# Patient Record
Sex: Female | Born: 1945 | Race: White | Hispanic: No | Marital: Married | Smoking: Current every day smoker
Health system: Southern US, Community
[De-identification: ages and names within clinical notes are randomized; demographics above are authoritative.]

## PROBLEM LIST (undated history)

## (undated) DIAGNOSIS — E1159 Type 2 diabetes mellitus with other circulatory complications: Secondary | ICD-10-CM

## (undated) DIAGNOSIS — I2510 Atherosclerotic heart disease of native coronary artery without angina pectoris: Secondary | ICD-10-CM

## (undated) DIAGNOSIS — C50919 Malignant neoplasm of unspecified site of unspecified female breast: Secondary | ICD-10-CM

## (undated) DIAGNOSIS — R42 Dizziness and giddiness: Secondary | ICD-10-CM

## (undated) DIAGNOSIS — E781 Pure hyperglyceridemia: Secondary | ICD-10-CM

## (undated) DIAGNOSIS — I6521 Occlusion and stenosis of right carotid artery: Secondary | ICD-10-CM

## (undated) DIAGNOSIS — Z720 Tobacco use: Secondary | ICD-10-CM

## (undated) DIAGNOSIS — M359 Systemic involvement of connective tissue, unspecified: Secondary | ICD-10-CM

## (undated) DIAGNOSIS — I700 Atherosclerosis of aorta: Secondary | ICD-10-CM

## (undated) DIAGNOSIS — R011 Cardiac murmur, unspecified: Secondary | ICD-10-CM

## (undated) DIAGNOSIS — K922 Gastrointestinal hemorrhage, unspecified: Secondary | ICD-10-CM

## (undated) DIAGNOSIS — Z87891 Personal history of nicotine dependence: Secondary | ICD-10-CM

## (undated) DIAGNOSIS — I6509 Occlusion and stenosis of unspecified vertebral artery: Secondary | ICD-10-CM

## (undated) DIAGNOSIS — Z66 Do not resuscitate: Secondary | ICD-10-CM

## (undated) DIAGNOSIS — I10 Essential (primary) hypertension: Secondary | ICD-10-CM

## (undated) DIAGNOSIS — E119 Type 2 diabetes mellitus without complications: Secondary | ICD-10-CM

## (undated) DIAGNOSIS — I739 Peripheral vascular disease, unspecified: Secondary | ICD-10-CM

## (undated) HISTORY — DX: Tobacco use: Z72.0

## (undated) HISTORY — DX: Essential (primary) hypertension: I10

## (undated) HISTORY — DX: Occlusion and stenosis of unspecified vertebral artery: I65.09

## (undated) HISTORY — DX: Occlusion and stenosis of right carotid artery: I65.21

## (undated) HISTORY — DX: Personal history of nicotine dependence: Z87.891

## (undated) HISTORY — DX: Atherosclerosis of aorta: I70.0

## (undated) HISTORY — DX: Pure hyperglyceridemia: E78.1

## (undated) HISTORY — DX: Do not resuscitate: Z66

## (undated) HISTORY — DX: Gastrointestinal hemorrhage, unspecified: K92.2

## (undated) HISTORY — DX: Type 2 diabetes mellitus with other circulatory complications: E11.59

## (undated) HISTORY — DX: Malignant neoplasm of unspecified site of unspecified female breast: C50.919

## (undated) HISTORY — DX: Type 2 diabetes mellitus without complications: E11.9

---

## 1978-02-20 HISTORY — PX: CHOLECYSTECTOMY: SHX55

## 1988-02-21 HISTORY — PX: BREAST SURGERY: SHX581

## 2012-07-10 DIAGNOSIS — I2510 Atherosclerotic heart disease of native coronary artery without angina pectoris: Secondary | ICD-10-CM | POA: Diagnosis not present

## 2012-07-10 DIAGNOSIS — G4730 Sleep apnea, unspecified: Secondary | ICD-10-CM | POA: Diagnosis not present

## 2012-07-10 DIAGNOSIS — E785 Hyperlipidemia, unspecified: Secondary | ICD-10-CM | POA: Diagnosis not present

## 2012-07-10 DIAGNOSIS — I10 Essential (primary) hypertension: Secondary | ICD-10-CM | POA: Diagnosis not present

## 2012-08-02 DIAGNOSIS — E785 Hyperlipidemia, unspecified: Secondary | ICD-10-CM | POA: Diagnosis not present

## 2012-08-02 DIAGNOSIS — C50919 Malignant neoplasm of unspecified site of unspecified female breast: Secondary | ICD-10-CM | POA: Diagnosis not present

## 2012-08-02 DIAGNOSIS — I10 Essential (primary) hypertension: Secondary | ICD-10-CM | POA: Diagnosis not present

## 2012-08-02 DIAGNOSIS — R5381 Other malaise: Secondary | ICD-10-CM | POA: Diagnosis not present

## 2012-08-19 DIAGNOSIS — I6529 Occlusion and stenosis of unspecified carotid artery: Secondary | ICD-10-CM | POA: Diagnosis not present

## 2012-08-19 DIAGNOSIS — I658 Occlusion and stenosis of other precerebral arteries: Secondary | ICD-10-CM | POA: Diagnosis not present

## 2012-08-19 DIAGNOSIS — I259 Chronic ischemic heart disease, unspecified: Secondary | ICD-10-CM | POA: Diagnosis not present

## 2012-08-20 DIAGNOSIS — I259 Chronic ischemic heart disease, unspecified: Secondary | ICD-10-CM | POA: Diagnosis not present

## 2012-08-20 DIAGNOSIS — I779 Disorder of arteries and arterioles, unspecified: Secondary | ICD-10-CM | POA: Diagnosis not present

## 2012-08-20 DIAGNOSIS — E7800 Pure hypercholesterolemia, unspecified: Secondary | ICD-10-CM | POA: Diagnosis not present

## 2012-08-20 DIAGNOSIS — C50919 Malignant neoplasm of unspecified site of unspecified female breast: Secondary | ICD-10-CM | POA: Diagnosis not present

## 2012-08-20 DIAGNOSIS — E119 Type 2 diabetes mellitus without complications: Secondary | ICD-10-CM | POA: Diagnosis not present

## 2012-08-20 DIAGNOSIS — I10 Essential (primary) hypertension: Secondary | ICD-10-CM | POA: Diagnosis not present

## 2012-08-20 DIAGNOSIS — E785 Hyperlipidemia, unspecified: Secondary | ICD-10-CM | POA: Diagnosis not present

## 2012-09-23 DIAGNOSIS — E119 Type 2 diabetes mellitus without complications: Secondary | ICD-10-CM | POA: Diagnosis not present

## 2012-09-23 DIAGNOSIS — I10 Essential (primary) hypertension: Secondary | ICD-10-CM | POA: Diagnosis not present

## 2012-09-23 DIAGNOSIS — E785 Hyperlipidemia, unspecified: Secondary | ICD-10-CM | POA: Diagnosis not present

## 2012-12-13 DIAGNOSIS — F17200 Nicotine dependence, unspecified, uncomplicated: Secondary | ICD-10-CM | POA: Diagnosis not present

## 2012-12-13 DIAGNOSIS — I6529 Occlusion and stenosis of unspecified carotid artery: Secondary | ICD-10-CM | POA: Diagnosis not present

## 2012-12-24 DIAGNOSIS — C50919 Malignant neoplasm of unspecified site of unspecified female breast: Secondary | ICD-10-CM | POA: Diagnosis not present

## 2012-12-24 DIAGNOSIS — E119 Type 2 diabetes mellitus without complications: Secondary | ICD-10-CM | POA: Diagnosis not present

## 2012-12-24 DIAGNOSIS — I10 Essential (primary) hypertension: Secondary | ICD-10-CM | POA: Diagnosis not present

## 2012-12-24 DIAGNOSIS — E785 Hyperlipidemia, unspecified: Secondary | ICD-10-CM | POA: Diagnosis not present

## 2013-07-09 DIAGNOSIS — I10 Essential (primary) hypertension: Secondary | ICD-10-CM | POA: Diagnosis not present

## 2013-07-09 DIAGNOSIS — IMO0001 Reserved for inherently not codable concepts without codable children: Secondary | ICD-10-CM | POA: Diagnosis not present

## 2013-07-14 DIAGNOSIS — I6529 Occlusion and stenosis of unspecified carotid artery: Secondary | ICD-10-CM | POA: Diagnosis not present

## 2013-07-16 ENCOUNTER — Ambulatory Visit: Payer: Self-pay | Admitting: Family Medicine

## 2013-07-16 DIAGNOSIS — I6529 Occlusion and stenosis of unspecified carotid artery: Secondary | ICD-10-CM | POA: Diagnosis not present

## 2013-07-16 DIAGNOSIS — I379 Nonrheumatic pulmonary valve disorder, unspecified: Secondary | ICD-10-CM | POA: Diagnosis not present

## 2013-07-16 DIAGNOSIS — I079 Rheumatic tricuspid valve disease, unspecified: Secondary | ICD-10-CM | POA: Diagnosis not present

## 2013-07-16 DIAGNOSIS — R55 Syncope and collapse: Secondary | ICD-10-CM | POA: Diagnosis not present

## 2013-07-16 DIAGNOSIS — I080 Rheumatic disorders of both mitral and aortic valves: Secondary | ICD-10-CM | POA: Diagnosis not present

## 2013-07-17 ENCOUNTER — Ambulatory Visit: Payer: Self-pay | Admitting: Family Medicine

## 2013-07-17 DIAGNOSIS — Z1389 Encounter for screening for other disorder: Secondary | ICD-10-CM | POA: Diagnosis not present

## 2013-07-17 DIAGNOSIS — I658 Occlusion and stenosis of other precerebral arteries: Secondary | ICD-10-CM | POA: Diagnosis not present

## 2013-07-17 DIAGNOSIS — I700 Atherosclerosis of aorta: Secondary | ICD-10-CM | POA: Diagnosis not present

## 2013-07-17 DIAGNOSIS — I6509 Occlusion and stenosis of unspecified vertebral artery: Secondary | ICD-10-CM | POA: Diagnosis not present

## 2013-07-17 DIAGNOSIS — I6529 Occlusion and stenosis of unspecified carotid artery: Secondary | ICD-10-CM | POA: Diagnosis not present

## 2013-07-17 DIAGNOSIS — I7090 Unspecified atherosclerosis: Secondary | ICD-10-CM | POA: Diagnosis not present

## 2013-07-17 DIAGNOSIS — F17200 Nicotine dependence, unspecified, uncomplicated: Secondary | ICD-10-CM | POA: Diagnosis not present

## 2013-07-17 LAB — COMPREHENSIVE METABOLIC PANEL
ALBUMIN: 3.9 g/dL (ref 3.4–5.0)
Alkaline Phosphatase: 78 U/L
Anion Gap: 4 — ABNORMAL LOW (ref 7–16)
BILIRUBIN TOTAL: 0.4 mg/dL (ref 0.2–1.0)
BUN: 16 mg/dL (ref 7–18)
CALCIUM: 9.8 mg/dL (ref 8.5–10.1)
Chloride: 102 mmol/L (ref 98–107)
Co2: 30 mmol/L (ref 21–32)
Creatinine: 0.85 mg/dL (ref 0.60–1.30)
EGFR (African American): >60
EGFR (Non-African Amer.): >60
Glucose: 221 mg/dL — ABNORMAL HIGH (ref 65–99)
OSMOLALITY: 280 (ref 275–301)
Potassium: 4.4 mmol/L (ref 3.5–5.1)
SGOT(AST): 22 U/L (ref 15–37)
SGPT (ALT): 20 U/L (ref 12–78)
Sodium: 136 mmol/L (ref 136–145)
TOTAL PROTEIN: 8.5 g/dL — AB (ref 6.4–8.2)

## 2013-07-17 LAB — CBC WITH DIFFERENTIAL/PLATELET
Basophil #: 0.2 10*3/uL — ABNORMAL HIGH (ref 0.0–0.1)
Basophil %: 1.5 %
EOS ABS: 0.1 10*3/uL (ref 0.0–0.7)
Eosinophil %: 0.8 %
HCT: 44.4 % (ref 35.0–47.0)
HGB: 14.5 g/dL (ref 12.0–16.0)
LYMPHS ABS: 2 10*3/uL (ref 1.0–3.6)
Lymphocyte %: 14.4 %
MCH: 29.8 pg (ref 26.0–34.0)
MCHC: 32.6 g/dL (ref 32.0–36.0)
MCV: 91 fL (ref 80–100)
MONO ABS: 0.7 x10 3/mm (ref 0.2–0.9)
Monocyte %: 5 %
NEUTROS ABS: 10.8 10*3/uL — AB (ref 1.4–6.5)
Neutrophil %: 78.3 %
Platelet: 321 10*3/uL (ref 150–440)
RBC: 4.87 10*6/uL (ref 3.80–5.20)
RDW: 13.3 % (ref 11.5–14.5)
WBC: 13.9 10*3/uL — ABNORMAL HIGH (ref 3.6–11.0)

## 2013-07-17 LAB — LIPID PANEL
CHOLESTEROL: 190 mg/dL (ref 0–200)
HDL Cholesterol: 34 mg/dL — ABNORMAL LOW (ref 40–60)
Ldl Cholesterol, Calc: 97 mg/dL (ref 0–100)
Triglycerides: 297 mg/dL — ABNORMAL HIGH (ref 0–200)
VLDL Cholesterol, Calc: 59 mg/dL — ABNORMAL HIGH (ref 5–40)

## 2013-07-17 LAB — APTT: ACTIVATED PTT: 31 s (ref 23.6–35.9)

## 2013-07-17 LAB — PROTIME-INR
INR: 1
PROTHROMBIN TIME: 13.2 s (ref 11.5–14.7)

## 2013-07-18 DIAGNOSIS — C50019 Malignant neoplasm of nipple and areola, unspecified female breast: Secondary | ICD-10-CM | POA: Diagnosis not present

## 2013-07-18 DIAGNOSIS — E785 Hyperlipidemia, unspecified: Secondary | ICD-10-CM | POA: Diagnosis not present

## 2013-07-18 DIAGNOSIS — I10 Essential (primary) hypertension: Secondary | ICD-10-CM | POA: Diagnosis not present

## 2013-07-18 DIAGNOSIS — Z01810 Encounter for preprocedural cardiovascular examination: Secondary | ICD-10-CM | POA: Diagnosis not present

## 2013-07-18 DIAGNOSIS — I6529 Occlusion and stenosis of unspecified carotid artery: Secondary | ICD-10-CM | POA: Diagnosis not present

## 2013-07-23 DIAGNOSIS — Z01810 Encounter for preprocedural cardiovascular examination: Secondary | ICD-10-CM | POA: Diagnosis not present

## 2013-07-23 DIAGNOSIS — Z01818 Encounter for other preprocedural examination: Secondary | ICD-10-CM | POA: Diagnosis not present

## 2013-08-20 HISTORY — PX: CAROTID ENDARTERECTOMY: SUR193

## 2013-08-26 DIAGNOSIS — I6529 Occlusion and stenosis of unspecified carotid artery: Secondary | ICD-10-CM | POA: Diagnosis not present

## 2013-08-26 DIAGNOSIS — F17200 Nicotine dependence, unspecified, uncomplicated: Secondary | ICD-10-CM | POA: Diagnosis not present

## 2013-09-11 ENCOUNTER — Ambulatory Visit: Payer: Self-pay | Admitting: Vascular Surgery

## 2013-09-11 DIAGNOSIS — I10 Essential (primary) hypertension: Secondary | ICD-10-CM | POA: Diagnosis not present

## 2013-09-11 DIAGNOSIS — Z01812 Encounter for preprocedural laboratory examination: Secondary | ICD-10-CM | POA: Diagnosis not present

## 2013-09-11 DIAGNOSIS — I6529 Occlusion and stenosis of unspecified carotid artery: Secondary | ICD-10-CM | POA: Diagnosis not present

## 2013-09-11 DIAGNOSIS — Z01818 Encounter for other preprocedural examination: Secondary | ICD-10-CM | POA: Diagnosis not present

## 2013-09-11 DIAGNOSIS — Z7982 Long term (current) use of aspirin: Secondary | ICD-10-CM | POA: Diagnosis not present

## 2013-09-11 DIAGNOSIS — E785 Hyperlipidemia, unspecified: Secondary | ICD-10-CM | POA: Diagnosis not present

## 2013-09-11 LAB — URINALYSIS, COMPLETE
Bilirubin,UR: NEGATIVE
Blood: NEGATIVE
GLUCOSE, UR: NEGATIVE mg/dL (ref 0–75)
Ketone: NEGATIVE
Leukocyte Esterase: NEGATIVE
Nitrite: NEGATIVE
PROTEIN: NEGATIVE
Ph: 5 (ref 4.5–8.0)
SPECIFIC GRAVITY: 1.017 (ref 1.003–1.030)
Squamous Epithelial: 27
WBC UR: 5 /HPF (ref 0–5)

## 2013-09-11 LAB — CBC
HCT: 39.8 % (ref 35.0–47.0)
HGB: 13.1 g/dL (ref 12.0–16.0)
MCH: 30.5 pg (ref 26.0–34.0)
MCHC: 33 g/dL (ref 32.0–36.0)
MCV: 93 fL (ref 80–100)
PLATELETS: 320 10*3/uL (ref 150–440)
RBC: 4.3 10*6/uL (ref 3.80–5.20)
RDW: 13.4 % (ref 11.5–14.5)
WBC: 12.2 10*3/uL — ABNORMAL HIGH (ref 3.6–11.0)

## 2013-09-11 LAB — BASIC METABOLIC PANEL
ANION GAP: 7 (ref 7–16)
BUN: 17 mg/dL (ref 7–18)
CALCIUM: 9.6 mg/dL (ref 8.5–10.1)
Chloride: 101 mmol/L (ref 98–107)
Co2: 28 mmol/L (ref 21–32)
Creatinine: 0.86 mg/dL (ref 0.60–1.30)
EGFR (Non-African Amer.): >60
Glucose: 203 mg/dL — ABNORMAL HIGH (ref 65–99)
Osmolality: 279 (ref 275–301)
Potassium: 4.5 mmol/L (ref 3.5–5.1)
Sodium: 136 mmol/L (ref 136–145)

## 2013-09-16 DIAGNOSIS — R7309 Other abnormal glucose: Secondary | ICD-10-CM | POA: Diagnosis not present

## 2013-09-16 DIAGNOSIS — N390 Urinary tract infection, site not specified: Secondary | ICD-10-CM | POA: Diagnosis not present

## 2013-09-16 DIAGNOSIS — E119 Type 2 diabetes mellitus without complications: Secondary | ICD-10-CM | POA: Diagnosis not present

## 2013-09-18 ENCOUNTER — Inpatient Hospital Stay: Payer: Self-pay | Admitting: Vascular Surgery

## 2013-09-18 DIAGNOSIS — I6529 Occlusion and stenosis of unspecified carotid artery: Secondary | ICD-10-CM | POA: Diagnosis not present

## 2013-09-18 DIAGNOSIS — Z7982 Long term (current) use of aspirin: Secondary | ICD-10-CM | POA: Diagnosis not present

## 2013-09-18 DIAGNOSIS — I708 Atherosclerosis of other arteries: Secondary | ICD-10-CM | POA: Diagnosis not present

## 2013-09-18 DIAGNOSIS — Z853 Personal history of malignant neoplasm of breast: Secondary | ICD-10-CM | POA: Diagnosis not present

## 2013-09-18 DIAGNOSIS — I10 Essential (primary) hypertension: Secondary | ICD-10-CM | POA: Diagnosis present

## 2013-09-18 DIAGNOSIS — I658 Occlusion and stenosis of other precerebral arteries: Secondary | ICD-10-CM | POA: Diagnosis present

## 2013-09-18 DIAGNOSIS — E785 Hyperlipidemia, unspecified: Secondary | ICD-10-CM | POA: Diagnosis present

## 2013-09-19 LAB — CBC WITH DIFFERENTIAL/PLATELET
BASOS ABS: 0 10*3/uL (ref 0.0–0.1)
Basophil %: 0.2 %
Eosinophil #: 0 10*3/uL (ref 0.0–0.7)
Eosinophil %: 0 %
HCT: 33.6 % — AB (ref 35.0–47.0)
HGB: 11.3 g/dL — ABNORMAL LOW (ref 12.0–16.0)
Lymphocyte #: 0.8 10*3/uL — ABNORMAL LOW (ref 1.0–3.6)
Lymphocyte %: 6.9 %
MCH: 30.4 pg (ref 26.0–34.0)
MCHC: 33.5 g/dL (ref 32.0–36.0)
MCV: 91 fL (ref 80–100)
Monocyte #: 0.4 x10 3/mm (ref 0.2–0.9)
Monocyte %: 3.6 %
NEUTROS ABS: 10.2 10*3/uL — AB (ref 1.4–6.5)
Neutrophil %: 89.3 %
PLATELETS: 233 10*3/uL (ref 150–440)
RBC: 3.71 10*6/uL — ABNORMAL LOW (ref 3.80–5.20)
RDW: 13.3 % (ref 11.5–14.5)
WBC: 11.4 10*3/uL — ABNORMAL HIGH (ref 3.6–11.0)

## 2013-09-19 LAB — APTT: ACTIVATED PTT: 29.3 s (ref 23.6–35.9)

## 2013-09-19 LAB — BASIC METABOLIC PANEL
Anion Gap: 8 (ref 7–16)
BUN: 11 mg/dL (ref 7–18)
CALCIUM: 8.1 mg/dL — AB (ref 8.5–10.1)
CREATININE: 0.7 mg/dL (ref 0.60–1.30)
Chloride: 110 mmol/L — ABNORMAL HIGH (ref 98–107)
Co2: 24 mmol/L (ref 21–32)
EGFR (African American): >60
GLUCOSE: 157 mg/dL — AB (ref 65–99)
Osmolality: 286 (ref 275–301)
Potassium: 3.9 mmol/L (ref 3.5–5.1)
SODIUM: 142 mmol/L (ref 136–145)

## 2013-09-19 LAB — PROTIME-INR
INR: 1.2
PROTHROMBIN TIME: 14.6 s (ref 11.5–14.7)

## 2013-09-22 LAB — PATHOLOGY REPORT

## 2013-10-02 DIAGNOSIS — I10 Essential (primary) hypertension: Secondary | ICD-10-CM | POA: Diagnosis not present

## 2013-10-02 DIAGNOSIS — E785 Hyperlipidemia, unspecified: Secondary | ICD-10-CM | POA: Diagnosis not present

## 2013-10-02 DIAGNOSIS — D649 Anemia, unspecified: Secondary | ICD-10-CM | POA: Diagnosis not present

## 2013-10-02 DIAGNOSIS — K922 Gastrointestinal hemorrhage, unspecified: Secondary | ICD-10-CM | POA: Diagnosis not present

## 2013-10-02 LAB — BASIC METABOLIC PANEL
Anion Gap: 12 (ref 7–16)
BUN: 24 mg/dL — ABNORMAL HIGH (ref 7–18)
CHLORIDE: 101 mmol/L (ref 98–107)
Calcium, Total: 8.8 mg/dL (ref 8.5–10.1)
Co2: 25 mmol/L (ref 21–32)
Creatinine: 0.79 mg/dL (ref 0.60–1.30)
EGFR (African American): >60
EGFR (Non-African Amer.): >60
GLUCOSE: 242 mg/dL — AB (ref 65–99)
OSMOLALITY: 288 (ref 275–301)
Potassium: 3.6 mmol/L (ref 3.5–5.1)
Sodium: 138 mmol/L (ref 136–145)

## 2013-10-02 LAB — CBC WITH DIFFERENTIAL/PLATELET
Basophil #: 0.1 10*3/uL (ref 0.0–0.1)
Basophil #: 0.2 10*3/uL — ABNORMAL HIGH (ref 0.0–0.1)
Basophil %: 0.8 %
Basophil %: 0.9 %
EOS ABS: 0.2 10*3/uL (ref 0.0–0.7)
EOS PCT: 1.4 %
Eosinophil #: 0.2 10*3/uL (ref 0.0–0.7)
Eosinophil %: 0.9 %
HCT: 23.5 % — ABNORMAL LOW (ref 35.0–47.0)
HCT: 25.7 % — ABNORMAL LOW (ref 35.0–47.0)
HGB: 8.3 g/dL — ABNORMAL LOW (ref 12.0–16.0)
HGB: 7.7 g/dL — AB (ref 12.0–16.0)
Lymphocyte #: 2.9 10*3/uL (ref 1.0–3.6)
Lymphocyte #: 3.5 10*3/uL (ref 1.0–3.6)
Lymphocyte %: 18.7 %
Lymphocyte %: 20 %
MCH: 29.9 pg (ref 26.0–34.0)
MCH: 30 pg (ref 26.0–34.0)
MCHC: 32.3 g/dL (ref 32.0–36.0)
MCHC: 32.6 g/dL (ref 32.0–36.0)
MCV: 93 fL (ref 80–100)
MCV: 92 fL (ref 80–100)
MONO ABS: 0.7 x10 3/mm (ref 0.2–0.9)
MONO ABS: 0.9 x10 3/mm (ref 0.2–0.9)
Monocyte %: 4.8 %
Monocyte %: 5.1 %
NEUTROS ABS: 11.5 10*3/uL — AB (ref 1.4–6.5)
NEUTROS ABS: 12.8 10*3/uL — AB (ref 1.4–6.5)
Neutrophil %: 73.1 %
Neutrophil %: 74.3 %
Platelet: 303 10*3/uL (ref 150–440)
Platelet: 360 10*3/uL (ref 150–440)
RBC: 2.77 10*6/uL — ABNORMAL LOW (ref 3.80–5.20)
RBC: 2.55 10*6/uL — AB (ref 3.80–5.20)
RDW: 12.9 % (ref 11.5–14.5)
RDW: 13.3 % (ref 11.5–14.5)
WBC: 15.5 10*3/uL — ABNORMAL HIGH (ref 3.6–11.0)
WBC: 17.5 10*3/uL — ABNORMAL HIGH (ref 3.6–11.0)

## 2013-10-03 DIAGNOSIS — K921 Melena: Secondary | ICD-10-CM | POA: Diagnosis not present

## 2013-10-03 DIAGNOSIS — R12 Heartburn: Secondary | ICD-10-CM | POA: Diagnosis not present

## 2013-10-03 DIAGNOSIS — R1013 Epigastric pain: Secondary | ICD-10-CM | POA: Diagnosis not present

## 2013-10-03 DIAGNOSIS — I10 Essential (primary) hypertension: Secondary | ICD-10-CM | POA: Diagnosis not present

## 2013-10-03 DIAGNOSIS — E785 Hyperlipidemia, unspecified: Secondary | ICD-10-CM | POA: Diagnosis not present

## 2013-10-03 DIAGNOSIS — K2981 Duodenitis with bleeding: Secondary | ICD-10-CM | POA: Diagnosis not present

## 2013-10-03 DIAGNOSIS — K2940 Chronic atrophic gastritis without bleeding: Secondary | ICD-10-CM | POA: Diagnosis not present

## 2013-10-03 DIAGNOSIS — K922 Gastrointestinal hemorrhage, unspecified: Secondary | ICD-10-CM | POA: Diagnosis not present

## 2013-10-03 DIAGNOSIS — Z7982 Long term (current) use of aspirin: Secondary | ICD-10-CM | POA: Diagnosis not present

## 2013-10-03 DIAGNOSIS — K449 Diaphragmatic hernia without obstruction or gangrene: Secondary | ICD-10-CM | POA: Diagnosis not present

## 2013-10-03 DIAGNOSIS — D62 Acute posthemorrhagic anemia: Secondary | ICD-10-CM | POA: Diagnosis not present

## 2013-10-03 LAB — CBC WITH DIFFERENTIAL/PLATELET
BASOS PCT: 0.3 %
Basophil #: 0.1 10*3/uL (ref 0.0–0.1)
Basophil #: 0.1 10*3/uL (ref 0.0–0.1)
Basophil %: 0.6 %
EOS ABS: 0.2 10*3/uL (ref 0.0–0.7)
EOS PCT: 1.4 %
Eosinophil #: 0 10*3/uL (ref 0.0–0.7)
Eosinophil %: 0.1 %
HCT: 20.9 % — ABNORMAL LOW (ref 35.0–47.0)
HCT: 25.2 % — ABNORMAL LOW (ref 35.0–47.0)
HGB: 6.8 g/dL — ABNORMAL LOW (ref 12.0–16.0)
HGB: 8.3 g/dL — ABNORMAL LOW (ref 12.0–16.0)
Lymphocyte #: 2.5 10*3/uL (ref 1.0–3.6)
Lymphocyte #: 1.5 10*3/uL (ref 1.0–3.6)
Lymphocyte %: 18.7 %
Lymphocyte %: 7.4 %
MCH: 30 pg (ref 26.0–34.0)
MCH: 30.1 pg (ref 26.0–34.0)
MCHC: 32.5 g/dL (ref 32.0–36.0)
MCHC: 32.7 g/dL (ref 32.0–36.0)
MCV: 92 fL (ref 80–100)
MCV: 92 fL (ref 80–100)
MONOS PCT: 4.2 %
Monocyte #: 0.6 x10 3/mm (ref 0.2–0.9)
Monocyte #: 0.5 x10 3/mm (ref 0.2–0.9)
Monocyte %: 2.6 %
NEUTROS ABS: 10.2 10*3/uL — AB (ref 1.4–6.5)
NEUTROS PCT: 89.6 %
Neutrophil #: 18.2 10*3/uL — ABNORMAL HIGH (ref 1.4–6.5)
Neutrophil %: 75.1 %
Platelet: 279 10*3/uL (ref 150–440)
Platelet: 277 10*3/uL (ref 150–440)
RBC: 2.27 10*6/uL — AB (ref 3.80–5.20)
RBC: 2.75 10*6/uL — AB (ref 3.80–5.20)
RDW: 13.2 % (ref 11.5–14.5)
RDW: 13.2 % (ref 11.5–14.5)
WBC: 13.6 10*3/uL — ABNORMAL HIGH (ref 3.6–11.0)
WBC: 20.3 10*3/uL — AB (ref 3.6–11.0)

## 2013-10-04 ENCOUNTER — Inpatient Hospital Stay: Payer: Self-pay | Admitting: Internal Medicine

## 2013-10-04 DIAGNOSIS — K2981 Duodenitis with bleeding: Secondary | ICD-10-CM | POA: Diagnosis not present

## 2013-10-04 DIAGNOSIS — K264 Chronic or unspecified duodenal ulcer with hemorrhage: Secondary | ICD-10-CM | POA: Diagnosis not present

## 2013-10-04 DIAGNOSIS — K449 Diaphragmatic hernia without obstruction or gangrene: Secondary | ICD-10-CM | POA: Diagnosis present

## 2013-10-04 DIAGNOSIS — K2970 Gastritis, unspecified, without bleeding: Secondary | ICD-10-CM | POA: Diagnosis present

## 2013-10-04 DIAGNOSIS — E785 Hyperlipidemia, unspecified: Secondary | ICD-10-CM | POA: Diagnosis present

## 2013-10-04 DIAGNOSIS — I10 Essential (primary) hypertension: Secondary | ICD-10-CM | POA: Diagnosis not present

## 2013-10-04 DIAGNOSIS — K922 Gastrointestinal hemorrhage, unspecified: Secondary | ICD-10-CM | POA: Diagnosis not present

## 2013-10-04 DIAGNOSIS — R7309 Other abnormal glucose: Secondary | ICD-10-CM | POA: Diagnosis present

## 2013-10-04 DIAGNOSIS — K5289 Other specified noninfective gastroenteritis and colitis: Secondary | ICD-10-CM | POA: Diagnosis not present

## 2013-10-04 DIAGNOSIS — I2510 Atherosclerotic heart disease of native coronary artery without angina pectoris: Secondary | ICD-10-CM | POA: Diagnosis present

## 2013-10-04 DIAGNOSIS — F17200 Nicotine dependence, unspecified, uncomplicated: Secondary | ICD-10-CM | POA: Diagnosis present

## 2013-10-04 DIAGNOSIS — D62 Acute posthemorrhagic anemia: Secondary | ICD-10-CM | POA: Diagnosis not present

## 2013-10-04 DIAGNOSIS — Z853 Personal history of malignant neoplasm of breast: Secondary | ICD-10-CM | POA: Diagnosis not present

## 2013-10-04 DIAGNOSIS — K2990 Gastroduodenitis, unspecified, without bleeding: Secondary | ICD-10-CM | POA: Diagnosis present

## 2013-10-04 DIAGNOSIS — Z7982 Long term (current) use of aspirin: Secondary | ICD-10-CM | POA: Diagnosis not present

## 2013-10-04 LAB — CBC WITH DIFFERENTIAL/PLATELET
BASOS ABS: 0.1 10*3/uL (ref 0.0–0.1)
Basophil %: 0.6 %
Eosinophil #: 0.1 10*3/uL (ref 0.0–0.7)
Eosinophil %: 0.6 %
HCT: 22.5 % — AB (ref 35.0–47.0)
HGB: 7.5 g/dL — ABNORMAL LOW (ref 12.0–16.0)
LYMPHS ABS: 2.5 10*3/uL (ref 1.0–3.6)
Lymphocyte %: 18 %
MCH: 30.3 pg (ref 26.0–34.0)
MCHC: 33.5 g/dL (ref 32.0–36.0)
MCV: 91 fL (ref 80–100)
MONO ABS: 0.6 x10 3/mm (ref 0.2–0.9)
MONOS PCT: 4.5 %
NEUTROS PCT: 76.3 %
Neutrophil #: 10.4 10*3/uL — ABNORMAL HIGH (ref 1.4–6.5)
PLATELETS: 275 10*3/uL (ref 150–440)
RBC: 2.49 10*6/uL — ABNORMAL LOW (ref 3.80–5.20)
RDW: 13.5 % (ref 11.5–14.5)
WBC: 13.6 10*3/uL — ABNORMAL HIGH (ref 3.6–11.0)

## 2013-10-04 LAB — PROTIME-INR
INR: 1.1
Prothrombin Time: 14.5 secs (ref 11.5–14.7)

## 2013-10-04 LAB — BASIC METABOLIC PANEL
Anion Gap: 6 — ABNORMAL LOW (ref 7–16)
BUN: 11 mg/dL (ref 7–18)
CO2: 27 mmol/L (ref 21–32)
Calcium, Total: 7.9 mg/dL — ABNORMAL LOW (ref 8.5–10.1)
Chloride: 109 mmol/L — ABNORMAL HIGH (ref 98–107)
Creatinine: 0.74 mg/dL (ref 0.60–1.30)
EGFR (African American): >60
EGFR (Non-African Amer.): >60
GLUCOSE: 132 mg/dL — AB (ref 65–99)
OSMOLALITY: 284 (ref 275–301)
Potassium: 3.5 mmol/L (ref 3.5–5.1)
Sodium: 142 mmol/L (ref 136–145)

## 2013-10-05 DIAGNOSIS — K922 Gastrointestinal hemorrhage, unspecified: Secondary | ICD-10-CM | POA: Diagnosis not present

## 2013-10-05 DIAGNOSIS — I10 Essential (primary) hypertension: Secondary | ICD-10-CM | POA: Diagnosis not present

## 2013-10-05 DIAGNOSIS — E785 Hyperlipidemia, unspecified: Secondary | ICD-10-CM | POA: Diagnosis not present

## 2013-10-05 DIAGNOSIS — D62 Acute posthemorrhagic anemia: Secondary | ICD-10-CM | POA: Diagnosis not present

## 2013-10-05 LAB — CBC WITH DIFFERENTIAL/PLATELET
Basophil #: 0.1 10*3/uL (ref 0.0–0.1)
Basophil %: 0.8 %
Eosinophil #: 0.2 10*3/uL (ref 0.0–0.7)
Eosinophil %: 1.9 %
HCT: 28.1 % — AB (ref 35.0–47.0)
HGB: 9.7 g/dL — ABNORMAL LOW (ref 12.0–16.0)
Lymphocyte #: 2.1 10*3/uL (ref 1.0–3.6)
Lymphocyte %: 24.2 %
MCH: 32 pg (ref 26.0–34.0)
MCHC: 34.5 g/dL (ref 32.0–36.0)
MCV: 93 fL (ref 80–100)
MONOS PCT: 6.3 %
Monocyte #: 0.6 x10 3/mm (ref 0.2–0.9)
NEUTROS ABS: 5.9 10*3/uL (ref 1.4–6.5)
NEUTROS PCT: 66.8 %
Platelet: 273 10*3/uL (ref 150–440)
RBC: 3.04 10*6/uL — ABNORMAL LOW (ref 3.80–5.20)
RDW: 14 % (ref 11.5–14.5)
WBC: 8.8 10*3/uL (ref 3.6–11.0)

## 2013-10-05 LAB — BASIC METABOLIC PANEL
ANION GAP: 9 (ref 7–16)
BUN: 7 mg/dL (ref 7–18)
CALCIUM: 7.9 mg/dL — AB (ref 8.5–10.1)
Chloride: 113 mmol/L — ABNORMAL HIGH (ref 98–107)
Co2: 25 mmol/L (ref 21–32)
Creatinine: 0.64 mg/dL (ref 0.60–1.30)
EGFR (Non-African Amer.): >60
GLUCOSE: 143 mg/dL — AB (ref 65–99)
OSMOLALITY: 293 (ref 275–301)
Potassium: 3.4 mmol/L — ABNORMAL LOW (ref 3.5–5.1)
SODIUM: 147 mmol/L — AB (ref 136–145)

## 2013-10-06 LAB — CBC WITH DIFFERENTIAL/PLATELET
BASOS PCT: 0.5 %
Basophil #: 0 10*3/uL (ref 0.0–0.1)
Eosinophil #: 0.2 10*3/uL (ref 0.0–0.7)
Eosinophil %: 1.9 %
HCT: 27.3 % — AB (ref 35.0–47.0)
HGB: 9.5 g/dL — AB (ref 12.0–16.0)
LYMPHS ABS: 1.8 10*3/uL (ref 1.0–3.6)
LYMPHS PCT: 19.9 %
MCH: 32.3 pg (ref 26.0–34.0)
MCHC: 34.6 g/dL (ref 32.0–36.0)
MCV: 93 fL (ref 80–100)
Monocyte #: 0.5 x10 3/mm (ref 0.2–0.9)
Monocyte %: 6.1 %
NEUTROS ABS: 6.4 10*3/uL (ref 1.4–6.5)
Neutrophil %: 71.6 %
Platelet: 289 10*3/uL (ref 150–440)
RBC: 2.93 10*6/uL — ABNORMAL LOW (ref 3.80–5.20)
RDW: 14.4 % (ref 11.5–14.5)
WBC: 8.9 10*3/uL (ref 3.6–11.0)

## 2013-10-09 DIAGNOSIS — I959 Hypotension, unspecified: Secondary | ICD-10-CM | POA: Diagnosis not present

## 2013-10-09 DIAGNOSIS — E119 Type 2 diabetes mellitus without complications: Secondary | ICD-10-CM | POA: Diagnosis not present

## 2013-10-09 DIAGNOSIS — K922 Gastrointestinal hemorrhage, unspecified: Secondary | ICD-10-CM | POA: Diagnosis not present

## 2013-10-21 DIAGNOSIS — I6529 Occlusion and stenosis of unspecified carotid artery: Secondary | ICD-10-CM | POA: Diagnosis not present

## 2013-10-21 DIAGNOSIS — K264 Chronic or unspecified duodenal ulcer with hemorrhage: Secondary | ICD-10-CM | POA: Diagnosis not present

## 2013-11-14 ENCOUNTER — Ambulatory Visit: Payer: Self-pay | Admitting: Unknown Physician Specialty

## 2013-11-14 DIAGNOSIS — E7800 Pure hypercholesterolemia, unspecified: Secondary | ICD-10-CM | POA: Diagnosis not present

## 2013-11-14 DIAGNOSIS — K2990 Gastroduodenitis, unspecified, without bleeding: Secondary | ICD-10-CM | POA: Diagnosis not present

## 2013-11-14 DIAGNOSIS — Z853 Personal history of malignant neoplasm of breast: Secondary | ICD-10-CM | POA: Diagnosis not present

## 2013-11-14 DIAGNOSIS — Z9010 Acquired absence of unspecified breast and nipple: Secondary | ICD-10-CM | POA: Diagnosis not present

## 2013-11-14 DIAGNOSIS — Z7982 Long term (current) use of aspirin: Secondary | ICD-10-CM | POA: Diagnosis not present

## 2013-11-14 DIAGNOSIS — I10 Essential (primary) hypertension: Secondary | ICD-10-CM | POA: Diagnosis not present

## 2013-11-14 DIAGNOSIS — Z79899 Other long term (current) drug therapy: Secondary | ICD-10-CM | POA: Diagnosis not present

## 2013-11-14 DIAGNOSIS — K2940 Chronic atrophic gastritis without bleeding: Secondary | ICD-10-CM | POA: Diagnosis not present

## 2013-11-14 DIAGNOSIS — K2211 Ulcer of esophagus with bleeding: Secondary | ICD-10-CM | POA: Diagnosis not present

## 2013-11-14 DIAGNOSIS — K5289 Other specified noninfective gastroenteritis and colitis: Secondary | ICD-10-CM | POA: Diagnosis not present

## 2013-11-14 DIAGNOSIS — I6529 Occlusion and stenosis of unspecified carotid artery: Secondary | ICD-10-CM | POA: Diagnosis not present

## 2013-11-14 DIAGNOSIS — K2970 Gastritis, unspecified, without bleeding: Secondary | ICD-10-CM | POA: Diagnosis not present

## 2013-11-18 LAB — PATHOLOGY REPORT

## 2014-01-29 DIAGNOSIS — E781 Pure hyperglyceridemia: Secondary | ICD-10-CM | POA: Diagnosis not present

## 2014-01-29 DIAGNOSIS — I708 Atherosclerosis of other arteries: Secondary | ICD-10-CM | POA: Diagnosis not present

## 2014-01-29 DIAGNOSIS — I10 Essential (primary) hypertension: Secondary | ICD-10-CM | POA: Diagnosis not present

## 2014-01-29 DIAGNOSIS — I739 Peripheral vascular disease, unspecified: Secondary | ICD-10-CM | POA: Diagnosis not present

## 2014-01-29 DIAGNOSIS — E1165 Type 2 diabetes mellitus with hyperglycemia: Secondary | ICD-10-CM | POA: Diagnosis not present

## 2014-01-29 DIAGNOSIS — R809 Proteinuria, unspecified: Secondary | ICD-10-CM | POA: Diagnosis not present

## 2014-03-23 DIAGNOSIS — H6690 Otitis media, unspecified, unspecified ear: Secondary | ICD-10-CM | POA: Diagnosis not present

## 2014-03-23 DIAGNOSIS — H6122 Impacted cerumen, left ear: Secondary | ICD-10-CM | POA: Diagnosis not present

## 2014-03-23 DIAGNOSIS — B009 Herpesviral infection, unspecified: Secondary | ICD-10-CM | POA: Diagnosis not present

## 2014-04-30 DIAGNOSIS — E1165 Type 2 diabetes mellitus with hyperglycemia: Secondary | ICD-10-CM | POA: Diagnosis not present

## 2014-04-30 DIAGNOSIS — L609 Nail disorder, unspecified: Secondary | ICD-10-CM | POA: Diagnosis not present

## 2014-04-30 DIAGNOSIS — L608 Other nail disorders: Secondary | ICD-10-CM | POA: Diagnosis not present

## 2014-04-30 DIAGNOSIS — E781 Pure hyperglyceridemia: Secondary | ICD-10-CM | POA: Diagnosis not present

## 2014-04-30 DIAGNOSIS — I739 Peripheral vascular disease, unspecified: Secondary | ICD-10-CM | POA: Diagnosis not present

## 2014-04-30 DIAGNOSIS — R011 Cardiac murmur, unspecified: Secondary | ICD-10-CM | POA: Diagnosis not present

## 2014-04-30 DIAGNOSIS — I10 Essential (primary) hypertension: Secondary | ICD-10-CM | POA: Diagnosis not present

## 2014-04-30 DIAGNOSIS — R809 Proteinuria, unspecified: Secondary | ICD-10-CM | POA: Diagnosis not present

## 2014-05-07 ENCOUNTER — Ambulatory Visit
Admit: 2014-05-07 | Disposition: A | Discharge status: Home / Self Care | Payer: Self-pay | Attending: Family Medicine | Admitting: Family Medicine

## 2014-05-08 ENCOUNTER — Ambulatory Visit: Payer: Self-pay | Admitting: Family Medicine

## 2014-05-08 DIAGNOSIS — E785 Hyperlipidemia, unspecified: Secondary | ICD-10-CM | POA: Diagnosis not present

## 2014-05-08 DIAGNOSIS — I2510 Atherosclerotic heart disease of native coronary artery without angina pectoris: Secondary | ICD-10-CM | POA: Diagnosis not present

## 2014-05-08 DIAGNOSIS — I70213 Atherosclerosis of native arteries of extremities with intermittent claudication, bilateral legs: Secondary | ICD-10-CM | POA: Diagnosis not present

## 2014-05-08 DIAGNOSIS — Z122 Encounter for screening for malignant neoplasm of respiratory organs: Secondary | ICD-10-CM | POA: Diagnosis not present

## 2014-05-08 DIAGNOSIS — I6529 Occlusion and stenosis of unspecified carotid artery: Secondary | ICD-10-CM | POA: Diagnosis not present

## 2014-05-08 DIAGNOSIS — R918 Other nonspecific abnormal finding of lung field: Secondary | ICD-10-CM | POA: Diagnosis not present

## 2014-05-08 DIAGNOSIS — I10 Essential (primary) hypertension: Secondary | ICD-10-CM | POA: Diagnosis not present

## 2014-05-08 DIAGNOSIS — M79609 Pain in unspecified limb: Secondary | ICD-10-CM | POA: Diagnosis not present

## 2014-05-08 DIAGNOSIS — F17210 Nicotine dependence, cigarettes, uncomplicated: Secondary | ICD-10-CM | POA: Diagnosis not present

## 2014-05-08 DIAGNOSIS — F17200 Nicotine dependence, unspecified, uncomplicated: Secondary | ICD-10-CM | POA: Diagnosis not present

## 2014-05-14 DIAGNOSIS — I10 Essential (primary) hypertension: Secondary | ICD-10-CM | POA: Diagnosis not present

## 2014-05-22 ENCOUNTER — Ambulatory Visit
Admit: 2014-05-22 | Disposition: A | Discharge status: Home / Self Care | Payer: Self-pay | Attending: Family Medicine | Admitting: Family Medicine

## 2014-05-23 ENCOUNTER — Encounter: Payer: Self-pay | Admitting: Family Medicine

## 2014-05-23 DIAGNOSIS — E1159 Type 2 diabetes mellitus with other circulatory complications: Secondary | ICD-10-CM | POA: Insufficient documentation

## 2014-05-23 DIAGNOSIS — I10 Essential (primary) hypertension: Secondary | ICD-10-CM | POA: Insufficient documentation

## 2014-05-23 DIAGNOSIS — R809 Proteinuria, unspecified: Secondary | ICD-10-CM | POA: Insufficient documentation

## 2014-05-23 DIAGNOSIS — I6509 Occlusion and stenosis of unspecified vertebral artery: Secondary | ICD-10-CM | POA: Insufficient documentation

## 2014-05-23 DIAGNOSIS — C50919 Malignant neoplasm of unspecified site of unspecified female breast: Secondary | ICD-10-CM | POA: Insufficient documentation

## 2014-05-23 DIAGNOSIS — I700 Atherosclerosis of aorta: Secondary | ICD-10-CM | POA: Insufficient documentation

## 2014-05-23 DIAGNOSIS — R011 Cardiac murmur, unspecified: Secondary | ICD-10-CM

## 2014-05-23 DIAGNOSIS — Z720 Tobacco use: Secondary | ICD-10-CM | POA: Insufficient documentation

## 2014-05-23 DIAGNOSIS — I739 Peripheral vascular disease, unspecified: Secondary | ICD-10-CM

## 2014-05-23 DIAGNOSIS — E781 Pure hyperglyceridemia: Secondary | ICD-10-CM | POA: Insufficient documentation

## 2014-05-23 DIAGNOSIS — I6521 Occlusion and stenosis of right carotid artery: Secondary | ICD-10-CM | POA: Insufficient documentation

## 2014-06-13 NOTE — Consult Note (Signed)
Pt had active bleeding from 2ed portion of duodenum on lateral wall which we treated with epi and cauterization. Spoke to Dr. Benjie Karvonen and she will move to CCU due to chance of rebleed.  Will consult surgery also.  Check hgb q6 hours for now. Pt had CEA 2 weeks ago.   Electronic Signatures: Manya Silvas (MD)  (Signed on 14-Aug-15 17:22)  Authored  Last Updated: 14-Aug-15 17:22 by Manya Silvas (MD)

## 2014-06-13 NOTE — Consult Note (Signed)
PATIENT NAME:  CARRI, SPILLERS MR#:  [id number] DATE OF BIRTH:  1946/02/01  DATE OF CONSULTATION:  10/03/2013  REFERRING PHYSICIAN:   CONSULTING PHYSICIAN:  Shaneika Rossa A. Lashe Oliveira, MD  REASON FOR CONSULTATION: Gastrointestinal bleed following carotid endarterectomy on aspirin and Plavix.   HISTORY OF PRESENT ILLNESS: Ms. Tierney is a pleasant 69 year old female with a history of hypertension, who presented with dark stools after a recent carotid endarterectomy. She was on aspirin and Plavix. This has been going on. She had talked to Dr. Lucky Cowboy, who took her off Plavix 4 days ago and began aspirin 81 down from 324. She began having intermittent abdominal pain, epigastric and burning. She denies any other further symptoms except for some fatigue. No fevers, chills, night sweats, shortness of breath, cough, chest pain, current nausea or vomiting. She does report black tarry stools. No diarrhea, constipation, dysuria or hematuria. She had an EGD, which showed a bleeding area on the wall of the second portion of her duodenum, which was injected with epinephrine.   PAST MEDICAL HISTORY: Hypertension, hyperlipidemia, recent left carotid endarterectomy.   HOME MEDICATIONS: At the time of arrival, she is on: Aspirin 81, Zocor, hydrochlorothiazide, lisinopril, metoprolol.   SOCIAL HISTORY: History of remote tobacco use. Denies alcohol or drugs.   FAMILY HISTORY: Positive for coronary artery disease as well as CVA.   ALLERGIES: No known drug allergies.   REVIEW OF SYSTEMS: A 12-point review of systems was obtained. Pertinent positives and negatives as above.   PHYSICAL EXAMINATION:  VITAL SIGNS: At the time of visiting her this morning, her temperature was 98.4, pulse of 84, blood pressure 98/44, respirations 16, 97% on room air.  GENERAL: No acute distress. Alert and oriented x 3.  HEAD: Normocephalic, atraumatic.  EYES: No scleral icterus. No conjunctivitis.  FACE: No obvious facial trauma. Normal  external nose. Normal external ears.  CHEST: Lungs clear to auscultation. Moving air well.  HEART: Regular rate and rhythm. No murmur, rubs, or gallops.  ABDOMEN: Soft, nontender, nondistended.  EXTREMITIES: Moves all extremities well. Strength 5/5.  NEUROLOGIC: Cranial nerves II through XII grossly intact.   LABORATORY DATA: Currently white cell count of 13.6; 15.5 at admission; 7.5 after 1 unit of blood, down from 8.3 at admission. Platelets 275,000.   ASSESSMENT AND PLAN: Ms. 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 She does not have sequelae of bleeding at this time. We will follow up on transfusion. If the patient begins having significant bleeding, this is in a difficult spot and may require transfer to [facility] setting to a pancreatic/upper GI surgeon who is more familiar with the anatomy in this area. We will continue to follow along while in-house.    ____________________________ Glena Norfolk. Lavonia Eager, MD cal:MT D: 10/04/2013 11:55:00 ET T: 10/04/2013 13:00:43 ET JOB#: [id number]  cc: Harrell Gave A. Jevon Littlepage, MD, <Dictator> Floyde Parkins MD ELECTRONICALLY SIGNED 10/13/2013 21:09

## 2014-06-13 NOTE — Discharge Summary (Signed)
PATIENT NAME:  Julia Cunningham, Julia Cunningham MR#:  [id number] DATE OF BIRTH:  01/29/1946  DATE OF ADMISSION:  10/04/2013 DATE OF DISCHARGE:  10/06/2013  DISCHARGE DIAGNOSES:  1.  Acute blood loss anemia, status post 2 units of packed RBC transfusion.  2.  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  3.  Recent carotid endarterectomy, on aspirin, Plavix.  4.  Hypertension.  5.  Hyperlipidemia. 6.  Tobacco abuse.   CONSULTATIONS: Gaylyn Cheers, MD  PROCEDURE: Upper gastrointestinal endoscopy showed normal esophagus, hiatal hernia, gastritis, blood in duodenal bulb and second part of duodenum.   ADMITTING HISTORY AND PHYSICAL: Please see detailed H and P dictated previously by Dr. Lavetta Nielsen. In brief, a 68 year old female patient brought into the hospital with dark stools. She was recently started on aspirin and Plavix after getting carotid endarterectomy. Was admitted after her stool was positive for blood with hemoglobin at 8.3.   HOSPITAL COURSE: 1.  Upper GI bleed. The patient's hemoglobin dropped. She was given 2 units of packed RBC transfusion because of her dark stools and stools being positive for blood and drop in hemoglobin, The patient was on chronic IV drip.  Later taken to EGD by Dr. Vira Agar after stabilization. Was found to have some blood in her duodenum, although no acute bleeding, possibly Dieulafoy lesion. The patient's hemoglobin has been stable. She is tolerating a diet. Has been on PPIs and is being discharged home with stable hemoglobin and no further bleeding.  2.  The patient's hypertension has been fairly controlled for her acute blood loss anemia. She is being started on iron supplementation, PPIs. The patient will start back on her aspirin as per Dr. Percell Boston recommendations from 10/09/2013.  PHYSICAL EXAMINATION:  On examination, the patient has no abdominal tenderness, bowel signs are normal.  Heart sounds S1 and S2 with lungs clear.  DISCHARGE MEDICATIONS:  1.   Protonix 40 mg 2 times a day.  2.  Aspirin 81 mg daily.  3.  Ferrous sulfate 325 mg oral 2 times a day with meals.  4.  Hydrochlorothiazide 25/lisinopril 20 one tablet daily.  5.  Simvastatin 40 mg daily.  6.  Metoprolol tartrate 50 mg 2 times a day.   DISCHARGE INSTRUCTIONS: Low-fat, low-cholesterol diet. Activity as tolerated. Follow up with Dr. Vira Agar in 1 to 2 weeks and primary care physician in 1 week. The patient needs to restart her aspirin from 10/09/2013 and return to the Emergency Room if there are any signs of bleeding.   The patient was also counseled for greater than 3 minutes to quit smoking on day of discharge.   TIME SPENT ON DAY OF DISCHARGE IN DISCHARGE ACTIVITY:  45 minutes    ____________________________ [name], MD srs:DT D: 10/06/2013 13:36:15 ET T: 10/06/2013 14:19:53 ET JOB#: [id number]  cc: Alveta Heimlich R. Darvin Neighbours, MD, <Dictator> Enid Derry, MD Neita Carp MD ELECTRONICALLY SIGNED 11/04/2013 16:26

## 2014-06-13 NOTE — Consult Note (Signed)
Pt had bleeding from second portion of duodenum, possible Dieulafoy lesion, treated with epi and bipolar probe.  Feeling good today, no hematemesis, no pain, no vomiting, no weakness when got up to bed side commode. chest clear, heart 2/6 SEM. abd not tender.  BP 98/44. WBC 13.6, PT 14.5. bun 11, K 3.5.  No signs of recurrent bleeding.  Will start water today, clear liq tomorrow.  Move from unit in afternoon if does ok this morning.  Consider 2ed look on Monday due to severity of bleeding.  Electronic Signatures: Manya Silvas (MD)  (Signed on 15-Aug-15 09:02)  Authored  Last Updated: 15-Aug-15 09:02 by Manya Silvas (MD)

## 2014-06-13 NOTE — Op Note (Signed)
PATIENT NAME:  Julia Cunningham, Julia Cunningham MR#:  [id number] DATE OF BIRTH:  21-Jun-1945  DATE OF PROCEDURE:  09/18/2013  PREOPERATIVE DIAGNOSES: 1.  High-grade left carotid artery stenosis.  2.  Right carotid artery occlusion.  3.  Hypertension.  4.  Hyperlipidemia.   POSTOPERATIVE DIAGNOSES: 1.  High-grade left carotid artery stenosis.  2.  Right carotid artery occlusion.  3.  Hypertension.  4.  Hyperlipidemia.   PROCEDURE:  Left carotid endarterectomy with CorMatrix arterial patch reconstruction.   SURGEON: Algernon Huxley, MD.  ASSISTANT:  Chelsea N. Hanne, PA-C.   ANESTHESIA: General.   BLOOD LOSS: 50 mL.   INDICATION FOR PROCEDURE: A 69 year old white female with about an 85% to 90% left ostia stenosis. This has suboptimal anatomy for carotid stenting with significant tortuosity and moderate calcification. She has a right carotid occlusion, which is chronic. She is going to have a left carotid endarterectomy for stroke risk reduction reasons. Risks and benefits were discussed. Informed consent was obtained.   DESCRIPTION OF PROCEDURE: The patient is brought to the operative suite. She was placed in modified beach chair position. After general anesthesia was obtained, a roll was placed under her shoulders.  Her neck was flexed and turned the right. The neck was then sterilely prepped and draped and a sterile surgical field was created. Incisions created along the anterior border of the sternocleidomastoid and dissected on the platysma with electrocautery. Using Weitlaner  retractor to help facilitate our exposure. The facial vein and a branch were ligated and divided between silk ties exposing the carotid bifurcation which was reasonably high.  A 2nd Weitlaner retractor was used to help facilitate our exposure. Fortunately, she was a smaller woman without a deep neck and we were able to dissect out the distal, internal carotid artery. After carefully protecting the hypoglossal nerve and taking several  vascular branches around the nerve to mobilize this. The external carotid artery, superior thyroid were also encircled with vessel loops. The patient was given 6000 units of intravenous heparin for systemic anticoagulation and this allowed to circulate for about 5 minutes.   Control was then pulled up on the vessel loops and an anterior wall arteriotomy was created with an 11 blade and extended with Potts scissors. The Pruitt-Inahara shunt was placed first in the internal carotid artery, flushed and de-aired, then in the common carotid artery, flushed and de-aired, then flow was restored. Approximately 2 to 3 minutes have lapsed between clamp and shunt placement. An endarterectomy was then performed in the typical fashion. The proximal endpoint was cut flush with tenotomy scissors. An eversion endarterectomy was performed on the external carotid artery and a nice feathered distal endpoint was created on the internal carotid artery.   All loose flecks were removed. The distal endpoint was tacked down with two 7-0 Prolene sutures. The patch was cut and beveled distally and a 6-0 Prolene was started at the distal endpoint and run 1/2 half the length of the arteriotomy. The patch was then cut and beveled to an appropriate length and a second 6-0 Prolene was started at the proximal endpoint. The medial suture line was run together and tied. The lateral suture line was run approximately 1/4  length of the arteriotomy, and then the shunt was removed. The vessel was flushed from the internal, external, and common carotid arteries and locally heparinized. The suture line was then completed, allowing several cardiac cycles to traverse up the external carotid artery prior to releasing control. Approximately 2 minutes elapsed from shunt  removal until restoration flow. A single 6-0 Prolene patch suture used for hemostasis. The wound was irrigated. Evicel was placed. The wound was then closed with 3 interrupted 3-0 Vicryl  sutures in the sternocleidomastoid space. The platysma was closed with a running 3-0 Vicryl and the skin was closed with 4-0 Monocryl. Dermabond was placed as a dressing. The patient was awakened from anesthesia and taken to the recovery room in stable condition, having tolerated the procedure well.    ____________________________ Algernon Huxley, MD jsd:ds D: 09/18/2013 14:29:10 ET T: 09/18/2013 14:52:20 ET JOB#: [id number]  cc: Algernon Huxley, MD, <Dictator> Algernon Huxley MD ELECTRONICALLY SIGNED 09/30/2013 14:12

## 2014-06-13 NOTE — Consult Note (Signed)
Pt doing well.  VSS afeb.  hgb up to 9.7, WBC 8.8.  Will start clear liq and advance to full liquids tomorrow.  She ambulated in room without dizzyness.  Chest clear on exam.  Maybe home Tuesday.  Follow hgb.  Electronic Signatures: Manya Silvas (MD)  (Signed on 16-Aug-15 09:28)  Authored  Last Updated: 16-Aug-15 09:28 by Manya Silvas (MD)

## 2014-06-13 NOTE — Consult Note (Signed)
VSS, hgb stable, no abd pain or vomiting, is very hungry and wants to go home to sleep in her own bed.  Will advance to full liquids this morning and if doing well can go home this evening on meds and follow with me if a few days in office.  Would start 81mg  ASA Wednesday. take with food.   Electronic Signatures: Manya Silvas (MD)  (Signed on 17-Aug-15 08:08)  Authored  Last Updated: 17-Aug-15 08:08 by Manya Silvas (MD)

## 2014-06-13 NOTE — H&P (Signed)
PATIENT NAME:  Julia Cunningham, Julia Cunningham MR#:  [id number] DATE OF BIRTH:  10-Apr-1945  DATE OF ADMISSION:  10/02/2013       PRIMARY CARE PHYSICIAN: Dr. Sanda Klein  VASCULAR SURGEON: Dr. Lucky Cowboy.   CHIEF COMPLAINT: Dark stools.  HISTORY OF PRESENT ILLNESS:  This is a 69 year old Caucasian female with a history of hypertension presenting with GI bleed and 2 weeks ago actually underwent left carotid endarterectomy without any acute complications. She was on high-dose aspirin before and after the surgery as well as started on Plavix. She developed dark, black, tarry stools for about 1 week in total duration.  She called Dr. Lucky Cowboy of vascular surgery who had taken her off of the Plavix medication about 4 days ago and decreased her aspirin from 324 to 81 mg. She is also complaining of intermittent abdominal pain, epigastric in location, and burning in quality, intermittent in timing 3 to 4 out of 10 in intensity, nonradiating, no worsening or relieving factors. Denotes having some fatigue, however, denies any further symptomatology.   REVIEW OF SYSTEMS: CONSTITUTIONAL: Positive for fatigue, weakness. Denies fevers, chills.  EYES: Denies blurred vision, double vision or eye pain.  EARS, NOSE, THROAT: Denies any tinnitus, ear pain, hearing loss.  RESPIRATORY: Denies coughing or shortness of breath.  CARDIOVASCULAR: Denies chest pain, palpitations, edema.  GASTROINTESTINAL: Denies nausea, vomiting and diarrhea. Positive for abdominal pain as described above. Positive for melena as described above.  GENITOURINARY: Denies dysuria, hematuria.  ENDOCRINE: Denies any thyroid problems.  HEMATOLOGIC: Positive for easy bruising as well as bleeding as  described above.  SKIN: Denies rash or lesion.  MUSCULOSKELETAL: No pain in neck, back, shoulder, knees, hips or arthritic symptoms.  NEUROLOGIC: Denies paralysis, paresthesias.  PSYCHIATRIC: Denies anxiety or depressive symptoms.  Otherwise, full review of systems performed by me is  negative.   PAST MEDICAL HISTORY: Hypertension, hyperlipidemia, recent left carotid endarterectomy.   SOCIAL HISTORY: Remote tobacco use. Denies any alcohol or drug use.   FAMILY HISTORY: Positive for coronary artery disease as well as CVA.   ALLERGIES: No known drug allergies.   HOME MEDICATIONS: Currently aspirin 81 mg p.o. q. daily, simvastatin 40 mg p.o. q. daily, hydrochlorothiazide/lisinopril 25/20 mg p.o. q. daily, metoprolol titrate 50 mg p.o. b.i.d.   PHYSICAL EXAMINATION:  VITAL SIGNS: Temperature 98.1, heart rate 99, respirations 20, blood pressure 117/72, saturating 99% on room air. Weight 64.9 kg. BMI 25.3.  GENERAL: Well-nourished, well-developed, Caucasian female in no acute distress.  HEAD: Normocephalic, atraumatic.  EYES: Pupils equal, round, reactive to light. Extraocular muscles intact. No scleral icterus.  MOUTH: Moist mucous membranes. Dentition intact. No abscess noted.  EARS, NOSE, AND THROAT:  Clear without exudates. No external lesions.  NECK: Supple. No thyromegaly. No nodules. No JVD.  PULMONARY: Clear to auscultation bilaterally without wheezes, rales or rhonchi.  No use of accessory muscles. Good respiratory effort. CHEST:  Nontender on palpation.   CARDIOVASCULAR: S1, S2, regular rate and rhythm. No murmurs, rubs, or gallops. No edema. Pedal pulses 2+ bilaterally.  GASTROINTESTINAL: Soft, nontender, nondistended. Positive bowel sounds. No hepatosplenomegaly.  MUSCULOSKELETAL: No swelling, clubbing, or edema. Range of motion is full in all extremities. NEUROLOGIC: Cranial nerves II-XII are intact. No gross focal neurological deficits. Sensation intact. Reflexes intact.  SKIN: No ulcerations, lesions, no rashes, or cyanosis. Skin warm and dry. Turgor intact. Well-healing incision located over the left neck without surrounding erythema or drainage.  PSYCHIATRIC: Mood and affect within normal limits.  The patient is awake, alert and oriented x  3.  Insight and  judgment intact.   LABORATORY DATA: Sodium 138, potassium 3.6, chloride 101, bicarbonate 25, BUN 24, creatinine 0.79, glucose 242. WBC 15.5, hemoglobin 8.3, platelets of 360,000.  ASSESSMENT AND PLAN: A 69 year old Caucasian female with history of hypertension who recently underwent left carotid endarterectomy presenting with GI bleed.  1. Upper GI bleed. Trend CBCs q. 6 hours. Transfusion threshold hemoglobin less than 7. GI consult. Continue with aspirin. We will hold Plavix as stated above. Also initiate proton pump inhibitor therapy and use Protonix intravenously.  2. Hypertension. Continue with lisinopril, Lopressor. Will hold diuretics for now.  3. Hyperlipidemia. Continue statin therapy. 4. Venous thromboembolism provided with sequential compression devices.   CODE STATUS: The patient is a full code.   TIME SPENT: 45 minutes.    ____________________________ Aaron Mose. Jaleisa Brose, MD dkh:JT D: 10/02/2013 22:17:06 ET T: 10/02/2013 22:32:14 ET JOB#: [id number]  cc: Aaron Mose. Jessejames Steelman, MD, <Dictator> Devynn Hessler Woodfin Ganja MD ELECTRONICALLY SIGNED 10/03/2013 23:05

## 2014-06-13 NOTE — Consult Note (Signed)
PATIENT NAME:  Julia Cunningham, Julia Cunningham MR#:  [id number] DATE OF BIRTH:  December 09, 1945  DATE OF CONSULTATION:  10/03/2013  REFERRING PHYSICIAN:   CONSULTING PHYSICIAN:  Joelene Millin A. Jerelene Redden, ANP (Adult Nurse Practitioner)  REFERRING PHYSICIAN: Aaron Mose. Hower, [place] PHYSICIAN: Gaylyn Cheers, MD, [facility]ice Paradise, ANP.   PRIMARY CARE PHYSICIAN: Enid Derry, MD, [facility].   VASCULAR SURGEON: Leotis Pain, MD.   REASON FOR CONSULTATION: Melena.   HISTORY OF PRESENT ILLNESS: This 69 year old patient has a history of hypertension and underwent left carotid endarterectomy 2 weeks ago without any acute complications. She was placed on high-dose aspirin 2 weeks before the surgery and was kept on it, as well as addition of Plavix. She developed black, tarry stools for about 1 week. She initially attributed it to eating kale. She contacted Dr. Bunnie Domino office and her Plavix was discontinued and her aspirin dose was lowered to 81 mg daily. She also had intermittent epigastric discomfort, burning, and intermittent flares of heartburn. She has a remote history of ulcer, and typically does not take heartburn medication. She was advised by her primary care provider to present to the hospital for melena evaluation. She was found to have a hemoglobin of 8.3 that has dropped to 6.8 today. GI has been consulted regarding luminal evaluation.   The patient is does have intermittent history of heartburn in the past 25 years. She can wake up at night about one time a month with heartburn. Her husband has to rub her back. She does not take mediaction for this. She denies NSAID use and utilizes Tylenol only for pain. She denies dysphagia. She denies abdominal pain, nausea or vomiting currently. She has had a cholecystectomy in the past. The patient has a history of tobacco use, negative alcohol use. Currently, she is asymptomatic. No history of upper endoscopy.   PAST MEDICAL HISTORY: 1. Hypertension.  2.  Hyperlipidemia.  3. Coronary artery disease, status post left carotid endarterectomy.  4. Hyperglycemia. She reports no formal diagnosis of diabetes, but she had to take diabetic medication to get her glucose under control before her carotid surgery.  5. Breast cancer  PAST SURGICAL HISTORY:  1. Carotid endarterectomy.  2. Cholecystectomy.  3. BTL.  4. Mastectomy  HOME MEDICATIONS:  1. Aspirin 81 mg daily.  2. Atorvastatin 20 mg daily.  3. Hydrochlorothiazide/lisinopril 25/20 mg daily.  4. Metoprolol 50 mg twice daily.   ALLERGIES: NKDA.   HABITS: A 53-pack-year tobacco history; discontinued. Negative alcohol or illicit drug use.   FAMILY HISTORY: Mother and grandmother with stomach ulcers. Positive for coronary artery disease, and CVA.   REVIEW OF SYSTEMS: Twelve systems reviewed and pertinent positives noted in the history of present illness. She has had fatigue, weakness, reflux about 1 time per month. She has intermittent epigastric burning, positive melena as described. Positive easy bruising. Remaining systems otherwise negative.   PHYSICAL EXAMINATION: VITAL SIGNS: Temperature 98.2, heart rate 75, blood pressure 120/78.  GENERAL: Well-appearing, well-nourished middle-age Caucasian female resting in bed, visiting with her husband. She is in no distress.  HEENT: She has normocephalic. Conjunctivae pink. Sclerae is anicteric. Oral mucosa is moist and intact.  NECK: Supple. Trachea midline.  CARDIAC: S1, S2. Positive systolic murmur.  LUNGS: CTA. Respirations are eupneic.  ABDOMEN: Soft, nontender in all quadrants. Positive bowel sounds. No hepatosplenomegaly or masses.  MUSCULOSKELETAL: No joint swelling. She does have pneumonic device on legs for prevention of DVT.   NEUROLOGIC: Cranial nerves II through XII grossly intact. No focal  neurologic deficits noted.  SKIN: Warm and dry. No rash noted. Incision left neck without  dressing, dry, bruising noted.    LABORATORY DATA:  Admission blood work with BUN 24, creatinine 0.79. Hemoglobin 8.3 and now 6.8. First unit PRBC is infusing. WBC was 15.5, now 13.6. Platelet count is normal, 279,000. Normocytic indices.   RADIOLOGY: No records to report.   IMPRESSION:  1. The patient the patient presents with melena. Acute drop in  hemoglobin suggest upper gastrointestinal bleed. This is status post carotid endarterectomy with Plavix and high-dose aspirin.  2. History of intermittent epigastric discomfort and reflux. Remote history of ulcer.  3. Hypertension.  4. Hyperlipidemia.   PLAN: 1. Recommend EGD this afternoon. This case was discussed with Dr. Vira Agar  in collaboration of care. The patient was counseled regarding the procedure, risks, benefits, and possible complications were discussed. She agrees to proceed.  2. Continue with IV Protonix at this time. Further GI recommendations pending luminal evaluation results.   These services provided by Denice Paradise.   ____________________________ Janalyn Harder Jerelene Redden, ANP (Adult Nurse Practitioner) kam:NT D: 10/03/2013 16:24:48 ET T: 10/03/2013 16:56:20 ET JOB#: [id number]  cc: Joelene Millin A. Jerelene Redden, ANP (Adult Nurse Practitioner), <Dictator> Janalyn Harder Sherlyn Hay, MSN, ANP-BC Adult Nurse Practitioner ELECTRONICALLY SIGNED 10/03/2013 19:20

## 2014-06-17 DIAGNOSIS — I10 Essential (primary) hypertension: Secondary | ICD-10-CM | POA: Diagnosis not present

## 2014-06-22 DIAGNOSIS — E781 Pure hyperglyceridemia: Secondary | ICD-10-CM | POA: Diagnosis not present

## 2014-06-22 DIAGNOSIS — E1165 Type 2 diabetes mellitus with hyperglycemia: Secondary | ICD-10-CM | POA: Diagnosis not present

## 2014-06-22 DIAGNOSIS — R809 Proteinuria, unspecified: Secondary | ICD-10-CM | POA: Diagnosis not present

## 2014-06-22 DIAGNOSIS — I10 Essential (primary) hypertension: Secondary | ICD-10-CM | POA: Diagnosis not present

## 2014-06-22 DIAGNOSIS — R011 Cardiac murmur, unspecified: Secondary | ICD-10-CM | POA: Diagnosis not present

## 2014-06-24 ENCOUNTER — Other Ambulatory Visit: Payer: Self-pay | Admitting: Vascular Surgery

## 2014-06-24 DIAGNOSIS — E785 Hyperlipidemia, unspecified: Secondary | ICD-10-CM | POA: Diagnosis not present

## 2014-06-24 DIAGNOSIS — I6529 Occlusion and stenosis of unspecified carotid artery: Secondary | ICD-10-CM | POA: Diagnosis not present

## 2014-06-24 DIAGNOSIS — M79609 Pain in unspecified limb: Secondary | ICD-10-CM | POA: Diagnosis not present

## 2014-06-24 DIAGNOSIS — I70213 Atherosclerosis of native arteries of extremities with intermittent claudication, bilateral legs: Secondary | ICD-10-CM | POA: Diagnosis not present

## 2014-06-24 DIAGNOSIS — F17200 Nicotine dependence, unspecified, uncomplicated: Secondary | ICD-10-CM | POA: Diagnosis not present

## 2014-06-24 DIAGNOSIS — I6522 Occlusion and stenosis of left carotid artery: Secondary | ICD-10-CM

## 2014-06-24 DIAGNOSIS — I10 Essential (primary) hypertension: Secondary | ICD-10-CM | POA: Diagnosis not present

## 2014-06-26 ENCOUNTER — Other Ambulatory Visit: Payer: Self-pay | Admitting: Family Medicine

## 2014-06-26 DIAGNOSIS — R011 Cardiac murmur, unspecified: Secondary | ICD-10-CM

## 2014-07-01 ENCOUNTER — Ambulatory Visit: Payer: Self-pay

## 2014-07-07 ENCOUNTER — Ambulatory Visit: Payer: Medicare Other

## 2014-07-07 DIAGNOSIS — I6529 Occlusion and stenosis of unspecified carotid artery: Secondary | ICD-10-CM | POA: Diagnosis not present

## 2014-07-07 DIAGNOSIS — E785 Hyperlipidemia, unspecified: Secondary | ICD-10-CM | POA: Diagnosis not present

## 2014-07-07 DIAGNOSIS — F17200 Nicotine dependence, unspecified, uncomplicated: Secondary | ICD-10-CM | POA: Diagnosis not present

## 2014-07-07 DIAGNOSIS — I70213 Atherosclerosis of native arteries of extremities with intermittent claudication, bilateral legs: Secondary | ICD-10-CM | POA: Diagnosis not present

## 2014-07-07 DIAGNOSIS — M79609 Pain in unspecified limb: Secondary | ICD-10-CM | POA: Diagnosis not present

## 2014-07-07 DIAGNOSIS — I10 Essential (primary) hypertension: Secondary | ICD-10-CM | POA: Diagnosis not present

## 2014-07-10 ENCOUNTER — Other Ambulatory Visit: Payer: Self-pay | Admitting: Vascular Surgery

## 2014-07-10 ENCOUNTER — Ambulatory Visit: Payer: Medicare Other

## 2014-07-10 DIAGNOSIS — I6529 Occlusion and stenosis of unspecified carotid artery: Secondary | ICD-10-CM

## 2014-07-13 ENCOUNTER — Ambulatory Visit
Admission: RE | Admit: 2014-07-13 | Discharge: 2014-07-13 | Disposition: A | Discharge status: Home / Self Care | Payer: Medicare Other | Source: Ambulatory Visit | Attending: Vascular Surgery | Admitting: Vascular Surgery

## 2014-07-13 DIAGNOSIS — I6503 Occlusion and stenosis of bilateral vertebral arteries: Secondary | ICD-10-CM | POA: Diagnosis not present

## 2014-07-13 DIAGNOSIS — I6523 Occlusion and stenosis of bilateral carotid arteries: Secondary | ICD-10-CM | POA: Diagnosis not present

## 2014-07-13 DIAGNOSIS — I6522 Occlusion and stenosis of left carotid artery: Secondary | ICD-10-CM

## 2014-07-13 DIAGNOSIS — I6529 Occlusion and stenosis of unspecified carotid artery: Secondary | ICD-10-CM | POA: Diagnosis present

## 2014-07-13 HISTORY — DX: Systemic involvement of connective tissue, unspecified: M35.9

## 2014-07-13 MED ORDER — IOHEXOL 350 MG/ML SOLN
80.0000 mL | Freq: Once | INTRAVENOUS | Status: AC | PRN
  Administered 2014-07-13: 80 mL via INTRAVENOUS

## 2014-07-24 DIAGNOSIS — I70213 Atherosclerosis of native arteries of extremities with intermittent claudication, bilateral legs: Secondary | ICD-10-CM | POA: Diagnosis not present

## 2014-07-24 DIAGNOSIS — F17200 Nicotine dependence, unspecified, uncomplicated: Secondary | ICD-10-CM | POA: Diagnosis not present

## 2014-07-24 DIAGNOSIS — I10 Essential (primary) hypertension: Secondary | ICD-10-CM | POA: Diagnosis not present

## 2014-07-24 DIAGNOSIS — M79609 Pain in unspecified limb: Secondary | ICD-10-CM | POA: Diagnosis not present

## 2014-07-24 DIAGNOSIS — E785 Hyperlipidemia, unspecified: Secondary | ICD-10-CM | POA: Diagnosis not present

## 2014-07-24 DIAGNOSIS — I6529 Occlusion and stenosis of unspecified carotid artery: Secondary | ICD-10-CM | POA: Diagnosis not present

## 2014-08-05 ENCOUNTER — Other Ambulatory Visit: Payer: Medicare Other

## 2014-08-05 DIAGNOSIS — E781 Pure hyperglyceridemia: Secondary | ICD-10-CM

## 2014-08-05 DIAGNOSIS — Z79899 Other long term (current) drug therapy: Secondary | ICD-10-CM | POA: Diagnosis not present

## 2014-08-05 DIAGNOSIS — I708 Atherosclerosis of other arteries: Secondary | ICD-10-CM

## 2014-08-05 DIAGNOSIS — E1165 Type 2 diabetes mellitus with hyperglycemia: Secondary | ICD-10-CM

## 2014-08-05 DIAGNOSIS — I10 Essential (primary) hypertension: Secondary | ICD-10-CM | POA: Diagnosis not present

## 2014-08-05 DIAGNOSIS — IMO0002 Reserved for concepts with insufficient information to code with codable children: Secondary | ICD-10-CM

## 2014-08-05 DIAGNOSIS — I7090 Unspecified atherosclerosis: Secondary | ICD-10-CM

## 2014-08-18 ENCOUNTER — Telehealth: Payer: Self-pay | Admitting: Family Medicine

## 2014-08-18 NOTE — Telephone Encounter (Signed)
Please remind patient to come in for labs Overdue Thanks, Dr. Sanda Klein

## 2014-08-18 NOTE — Telephone Encounter (Signed)
Patient notified

## 2014-08-31 ENCOUNTER — Telehealth: Payer: Self-pay | Admitting: Family Medicine

## 2014-08-31 NOTE — Telephone Encounter (Signed)
I've reminded patient to come in for labs.

## 2014-08-31 NOTE — Telephone Encounter (Signed)
Please check on labs from mid-June I do not have results yet Thank you

## 2014-09-01 ENCOUNTER — Other Ambulatory Visit: Payer: Medicare Other

## 2014-09-01 ENCOUNTER — Other Ambulatory Visit: Payer: Self-pay

## 2014-09-01 MED ORDER — GLUCOSE BLOOD VI STRP: ORAL_STRIP | Status: AC

## 2014-09-01 MED ORDER — LANCETS 30G MISC: Status: AC

## 2014-09-02 ENCOUNTER — Telehealth: Payer: Self-pay

## 2014-09-02 ENCOUNTER — Encounter: Payer: Self-pay | Admitting: Family Medicine

## 2014-09-02 LAB — LIPID PANEL W/O CHOL/HDL RATIO
Cholesterol, Total: 157 mg/dL (ref 100–199)
HDL: 36 mg/dL — ABNORMAL LOW (ref >39)
LDL Calculated: 66 mg/dL (ref 0–99)
Triglycerides: 273 mg/dL — ABNORMAL HIGH (ref 0–149)
VLDL CHOLESTEROL CAL: 55 mg/dL — AB (ref 5–40)

## 2014-09-02 LAB — HGB A1C W/O EAG: HEMOGLOBIN A1C: 7.7 % — AB (ref 4.8–5.6)

## 2014-09-02 LAB — ALT: ALT: 14 IU/L (ref 0–32)

## 2014-09-02 MED ORDER — METFORMIN HCL ER 500 MG PO TB24: 2000.0000 mg | ORAL_TABLET | Freq: Every evening | ORAL | Status: DC

## 2014-09-02 MED ORDER — ATORVASTATIN CALCIUM 40 MG PO TABS: 40.0000 mg | ORAL_TABLET | Freq: Every day | ORAL | Status: DC

## 2014-09-02 NOTE — Telephone Encounter (Signed)
A1C not quite to goal, 7.7 and needs to be under 7 Ask patient if she thinks she can work harder on her own to bring this down or is she ready to add another pill too? If the A1C (average blood sugar) stays high, it increases the risk of complications of diabetes, such as heart disease, damage to the eyes and kidneys and nerves If she really wants to buckle down and do this on her own, we can try that, or back to me if she's already doing all she can and needs medicine help

## 2014-09-02 NOTE — Telephone Encounter (Signed)
I'm unsure why the HgB A1C hasn't come across yet, but [employer] it out and it was 7.7%.

## 2014-09-02 NOTE — Telephone Encounter (Signed)
-----   Message from Arnetha Courser, MD sent at 09/02/2014  1:08 PM EDT ----- Regarding: I received lipids, alt, don't see A1C   ----- Message -----    From: SYSTEM    Sent: 08/10/2014  12:04 AM      To: Arnetha Courser, MD

## 2014-09-02 NOTE — Telephone Encounter (Signed)
Patient notified

## 2014-09-02 NOTE — Telephone Encounter (Signed)
Routing to provider  

## 2014-09-03 NOTE — Telephone Encounter (Signed)
Patient notified of lab results. She wants to work on diet for now. Dr. Sanda Klein did refill her Atorvastatin.

## 2014-10-30 DIAGNOSIS — K047 Periapical abscess without sinus: Secondary | ICD-10-CM | POA: Diagnosis not present

## 2014-11-03 ENCOUNTER — Encounter: Payer: Self-pay | Admitting: Family Medicine

## 2014-11-03 NOTE — Telephone Encounter (Signed)
[place] - can you please take care of this for her and help her? I'll be glad to sign whatever is needed

## 2014-11-05 ENCOUNTER — Telehealth: Payer: Self-pay | Admitting: Family Medicine

## 2014-11-05 NOTE — Telephone Encounter (Signed)
PT CALLED IN AND WANTED TO CHECK ON THE THINGS SHE EMAILED DR LADA ABOUT. PT WOULD LIKE A CALL BACK TO KNOW THE STATUS OF WHAT'S GOING ON.

## 2014-12-07 ENCOUNTER — Ambulatory Visit (INDEPENDENT_AMBULATORY_CARE_PROVIDER_SITE_OTHER): Payer: Medicare Other | Admitting: Family Medicine

## 2014-12-07 ENCOUNTER — Other Ambulatory Visit: Payer: Medicare Other

## 2014-12-07 ENCOUNTER — Encounter: Payer: Self-pay | Admitting: Family Medicine

## 2014-12-07 VITALS — BP 137/75 | HR 64 | Temp 97.8°F | Ht 61.25 in | Wt 147.0 lb

## 2014-12-07 DIAGNOSIS — I10 Essential (primary) hypertension: Secondary | ICD-10-CM

## 2014-12-07 DIAGNOSIS — Z5181 Encounter for therapeutic drug level monitoring: Secondary | ICD-10-CM | POA: Diagnosis not present

## 2014-12-07 DIAGNOSIS — I739 Peripheral vascular disease, unspecified: Secondary | ICD-10-CM

## 2014-12-07 DIAGNOSIS — E1165 Type 2 diabetes mellitus with hyperglycemia: Secondary | ICD-10-CM | POA: Diagnosis not present

## 2014-12-07 DIAGNOSIS — Z720 Tobacco use: Secondary | ICD-10-CM | POA: Diagnosis not present

## 2014-12-07 DIAGNOSIS — IMO0001 Reserved for inherently not codable concepts without codable children: Secondary | ICD-10-CM

## 2014-12-07 DIAGNOSIS — I6529 Occlusion and stenosis of unspecified carotid artery: Secondary | ICD-10-CM

## 2014-12-07 MED ORDER — ATORVASTATIN CALCIUM 40 MG PO TABS: 20.0000 mg | ORAL_TABLET | Freq: Every day | ORAL | Status: DC

## 2014-12-07 NOTE — Assessment & Plan Note (Signed)
Encouraged cessation, I am here to help if needed; see AVS

## 2014-12-07 NOTE — Assessment & Plan Note (Signed)
Followed by Dr. Lucky Cowboy; sees him Friday; quit smoking, control lipids, sugar, etc.

## 2014-12-07 NOTE — Patient Instructions (Addendum)
Decrease the atorvastatin from 40 mg to 20 mg at bedtime; be aware that this will increase your risk of blockages Get 25 or more grams of fiber a day Try to get 5 servings of fruits and vegetables a day Cut down on saturated fats We'll check labs today Try switching from 2% milk to 1% milk Please do see an eye doctor soon and go yearly Get in the habit of checking your feet every night I do recommend yearly flu shots; for individuals who don't want flu shots, try to practice excellent hand hygiene, and avoid nursing homes, day cares, and hospitals during peak flu season; 1000 mg of vitamin C daily during flu/cold season may help boost your immune system too Do try to quit smoking, it will be the single best thing you can do for your health and your wallet  Smoking Cessation, Tips for Success If you are ready to quit smoking, congratulations! You have chosen to help yourself be healthier. Cigarettes bring nicotine, tar, carbon monoxide, and other irritants into your body. Your lungs, heart, and blood vessels will be able to work better without these poisons. There are many different ways to quit smoking. Nicotine gum, nicotine patches, a nicotine inhaler, or nicotine nasal spray can help with physical craving. Hypnosis, support groups, and medicines help break the habit of smoking. WHAT THINGS CAN I DO TO MAKE QUITTING EASIER?  Here are some tips to help you quit for good:  Pick a date when you will quit smoking completely. Tell all of your friends and family about your plan to quit on that date.  Do not try to slowly cut down on the number of cigarettes you are smoking. Pick a quit date and quit smoking completely starting on that day.  Throw away all cigarettes.   Clean and remove all ashtrays from your home, work, and car.  On a card, write down your reasons for quitting. Carry the card with you and read it when you get the urge to smoke.  Cleanse your body of nicotine. Drink enough  water and fluids to keep your urine clear or pale yellow. Do this after quitting to flush the nicotine from your body.  Learn to predict your moods. Do not let a bad situation be your excuse to have a cigarette. Some situations in your life might tempt you into wanting a cigarette.  Never have "just one" cigarette. It leads to wanting another and another. Remind yourself of your decision to quit.  Change habits associated with smoking. If you smoked while driving or when feeling stressed, try other activities to replace smoking. Stand up when drinking your coffee. Brush your teeth after eating. Sit in a different chair when you read the paper. Avoid alcohol while trying to quit, and try to drink fewer caffeinated beverages. Alcohol and caffeine may urge you to smoke.  Avoid foods and drinks that can trigger a desire to smoke, such as sugary or spicy foods and alcohol.  Ask people who smoke not to smoke around you.  Have something planned to do right after eating or having a cup of coffee. For example, plan to take a walk or exercise.  Try a relaxation exercise to calm you down and decrease your stress. Remember, you may be tense and nervous for the first 2 weeks after you quit, but this will pass.  Find new activities to keep your hands busy. Play with a pen, coin, or rubber band. Doodle or draw things on paper.  Brush your teeth right after eating. This will help cut down on the craving for the taste of tobacco after meals. You can also try mouthwash.   Use oral substitutes in place of cigarettes. Try using lemon drops, carrots, cinnamon sticks, or chewing gum. Keep them handy so they are available when you have the urge to smoke.  When you have the urge to smoke, try deep breathing.  Designate your home as a nonsmoking area.  If you are a heavy smoker, ask your health care provider about a prescription for nicotine chewing gum. It can ease your withdrawal from nicotine.  Reward  yourself. Set aside the cigarette money you save and buy yourself something nice.  Look for support from others. Join a support group or smoking cessation program. Ask someone at home or at work to help you with your plan to quit smoking.  Always ask yourself, "Do I need this cigarette or is this just a reflex?" Tell yourself, "Today, I choose not to smoke," or "I do not want to smoke." You are reminding yourself of your decision to quit.  Do not replace cigarette smoking with electronic cigarettes (commonly called e-cigarettes). The safety of e-cigarettes is unknown, and some may contain harmful chemicals.  If you relapse, do not give up! Plan ahead and think about what you will do the next time you get the urge to smoke. HOW WILL I FEEL WHEN I QUIT SMOKING? You may have symptoms of withdrawal because your body is used to nicotine (the addictive substance in cigarettes). You may crave cigarettes, be irritable, feel very hungry, cough often, get headaches, or have difficulty concentrating. The withdrawal symptoms are only temporary. They are strongest when you first quit but will go away within 10-14 days. When withdrawal symptoms occur, stay in control. Think about your reasons for quitting. Remind yourself that these are signs that your body is healing and getting used to being without cigarettes. Remember that withdrawal symptoms are easier to treat than the major diseases that smoking can cause.  Even after the withdrawal is over, expect periodic urges to smoke. However, these cravings are generally short lived and will go away whether you smoke or not. Do not smoke! WHAT RESOURCES ARE AVAILABLE TO HELP ME QUIT SMOKING? Your health care provider can direct you to community resources or hospitals for support, which may include:  Group support.  Education.  Hypnosis.  Therapy.   This information is not intended to replace advice given to you by your health care provider. Make sure you  discuss any questions you have with your health care provider.   Document Released: 11/05/2003 Document Revised: 02/27/2014 Document Reviewed: 07/25/2012 Elsevier Interactive Patient Education [employer].

## 2014-12-07 NOTE — Assessment & Plan Note (Signed)
Check A1C today; foot exam by MD today; patient to call and schedule appt; foot exam nightly by patient; limit sweets, quit smoking

## 2014-12-07 NOTE — Assessment & Plan Note (Signed)
wellc-ontrolled; continue DASH guidelines

## 2014-12-07 NOTE — Progress Notes (Signed)
BP 137/75 mmHg  Pulse 64  Temp(Src) 97.8 F (36.6 C)  Ht 5' 1.25" (1.556 m)  Wt 147 lb (66.679 kg)  BMI 27.54 kg/m2  SpO2 100%   Subjective:    Patient ID: Julia Cunningham, female    DOB: [birth year], 69 y.o.   MRN: [id number]  HPI: Julia Cunningham is a 69 y.o. female  Chief Complaint  Patient presents with  . Hyperlipidemia  . Hypertension  . Diabetes   Diabetes; checking sugars once a day or twice a day, every other day, 1x a day on average; over the last two weeks; lowest was 120, highest 140; foot pain; does not want flu shot; no low sugar symptoms; tries to limit sweets; no sugary drinks, everything is sweet 'n' low; sugarfree candy; last eye exam was 'long ago"  Hypertension; well-controlled; taking medicine; checking BP at home, steady  High cholesterol; decreased statin on her own, just two nights of reduced dose; taking krill oil for high TG; not many eggs, maybe 2 per week; drinks 2% milk, half and half in coffee; very little processed pork  Relevant past medical, surgical, family and social history reviewed and updated as indicated. Interim medical history since our last visit reviewed. Allergies and medications reviewed and updated. Smoking 1 ppd; she has some stuff at home for smoking cessation, she bought lozenges and gum; she has it there when she is ready to quit  Review of Systems  Cardiovascular: Negative for chest pain.  Skin:       Odd skin sensation, like spider webs on the arms and legs   Pain in both feet; she thinks due to statin; cut down on statin on her own and pain improved; whole foot, not just the bottom of the foot Per HPI unless specifically indicated above     Objective:    BP 137/75 mmHg  Pulse 64  Temp(Src) 97.8 F (36.6 C)  Ht 5' 1.25" (1.556 m)  Wt 147 lb (66.679 kg)  BMI 27.54 kg/m2  SpO2 100%  Wt Readings from Last 3 Encounters:  12/07/14 147 lb (66.679 kg)  12/07/14 147 lb (66.679 kg)  05/23/14 148 lb (67.132 kg)    Physical  Exam  Constitutional: She appears well-developed and well-nourished. No distress.  HENT:  Head: Normocephalic and atraumatic.  Eyes: EOM are normal. No scleral icterus.  Neck: No thyromegaly present.  Cardiovascular: Normal rate, regular rhythm and normal heart sounds.   No murmur heard. Pulmonary/Chest: Effort normal and breath sounds normal. No respiratory distress. She has no wheezes.  Abdominal: Soft. Bowel sounds are normal. She exhibits no distension.  Musculoskeletal: Normal range of motion. She exhibits no edema.  Neurological: She is alert. She exhibits normal muscle tone.  Skin: Skin is warm and dry. She is not diaphoretic. No pallor.  Psychiatric: She has a normal mood and affect. Her behavior is normal. Judgment and thought content normal.   Results for orders placed or performed in visit on 08/05/14  Hgb A1c w/o eAG  Result Value Ref Range   Hgb A1c MFr Bld 7.7 (H) 4.8 - 5.6 %  Lipid Panel w/o Chol/HDL Ratio  Result Value Ref Range   Cholesterol, Total 157 100 - 199 mg/dL   Triglycerides 273 (H) 0 - 149 mg/dL   HDL 36 (L) >39 mg/dL   VLDL Cholesterol Cal 55 (H) 5 - 40 mg/dL   LDL Calculated 66 0 - 99 mg/dL  ALT  Result Value Ref Range   ALT 14  0 - 32 IU/L      Assessment & Plan:   Problem List Items Addressed This Visit      Cardiovascular and Mediastinum   Essential hypertension, benign    wellc-ontrolled; continue DASH guidelines      Relevant Medications   atorvastatin (LIPITOR) 40 MG tablet   Peripheral vascular disease ([place])    Followed by Dr. Lucky Cowboy; sees him Friday; quit smoking, control lipids, sugar, etc.      Relevant Medications   atorvastatin (LIPITOR) 40 MG tablet     Other   Tobacco abuse    Encouraged cessation, I am here to help if needed; see AVS      Uncontrolled diabetes mellitus type 2 without complications ([place]) - Primary    Check A1C today; foot exam by MD today; patient to call and schedule appt; foot exam nightly by patient;  limit sweets, quit smoking      Relevant Medications   atorvastatin (LIPITOR) 40 MG tablet   Other Relevant Orders   Hgb A1c w/o eAG   Lipid Panel w/o Chol/HDL Ratio   Microalbumin / creatinine urine ratio   Medication monitoring encounter   Relevant Orders   CBC with Differential/Platelet   Comprehensive metabolic panel      Follow up plan: Return in about 3 months (around 03/09/2015) for diabetes, cholesterol.   Orders Placed This Encounter  Procedures  . Hgb A1c w/o eAG  . Lipid Panel w/o Chol/HDL Ratio  . CBC with Differential/Platelet  . Comprehensive metabolic panel  . Microalbumin / creatinine urine ratio   An after-visit summary was printed and given to the patient at [facility].  Please see the patient instructions which may contain other information and recommendations beyond what is mentioned above in the assessment and plan.

## 2014-12-08 LAB — COMPREHENSIVE METABOLIC PANEL
ALBUMIN: 3.9 g/dL (ref 3.6–4.8)
ALK PHOS: 81 IU/L (ref 39–117)
ALT: 12 IU/L (ref 0–32)
AST: 13 IU/L (ref 0–40)
Albumin/Globulin Ratio: 1.3 (ref 1.1–2.5)
BILIRUBIN TOTAL: 0.3 mg/dL (ref 0.0–1.2)
BUN / CREAT RATIO: 21 (ref 11–26)
BUN: 18 mg/dL (ref 8–27)
CHLORIDE: 99 mmol/L (ref 97–106)
CO2: 24 mmol/L (ref 18–29)
CREATININE: 0.87 mg/dL (ref 0.57–1.00)
Calcium: 9.6 mg/dL (ref 8.7–10.3)
GFR calc Af Amer: 80 mL/min/{1.73_m2} (ref >59)
GFR calc non Af Amer: 69 mL/min/{1.73_m2} (ref >59)
GLUCOSE: 159 mg/dL — AB (ref 65–99)
Globulin, Total: 3 g/dL (ref 1.5–4.5)
Potassium: 4.9 mmol/L (ref 3.5–5.2)
Sodium: 140 mmol/L (ref 136–144)
Total Protein: 6.9 g/dL (ref 6.0–8.5)

## 2014-12-08 LAB — MICROALBUMIN / CREATININE URINE RATIO
CREATININE, UR: 70.9 mg/dL
MICROALB/CREAT RATIO: 14.1 mg/g creat (ref 0.0–30.0)
MICROALBUM., U, RANDOM: 10 ug/mL

## 2014-12-08 LAB — LIPID PANEL W/O CHOL/HDL RATIO
Cholesterol, Total: 193 mg/dL (ref 100–199)
HDL: 36 mg/dL — ABNORMAL LOW (ref >39)
LDL CALC: 90 mg/dL (ref 0–99)
Triglycerides: 334 mg/dL — ABNORMAL HIGH (ref 0–149)
VLDL Cholesterol Cal: 67 mg/dL — ABNORMAL HIGH (ref 5–40)

## 2014-12-08 LAB — HGB A1C W/O EAG: HEMOGLOBIN A1C: 8 % — AB (ref 4.8–5.6)

## 2014-12-09 ENCOUNTER — Telehealth: Payer: Self-pay

## 2014-12-09 MED ORDER — LINAGLIPTIN 5 MG PO TABS: 5.0000 mg | ORAL_TABLET | Freq: Every day | ORAL | Status: DC

## 2014-12-09 MED ORDER — ATORVASTATIN CALCIUM 40 MG PO TABS: 40.0000 mg | ORAL_TABLET | Freq: Every day | ORAL | Status: DC

## 2014-12-09 NOTE — Telephone Encounter (Signed)
Patient called wanting her lab results. Dr. Sanda Klein spoke with patient and advised her that her HgBA1c has increased. She offered to either add another diabetes medicine or buckle down on her diet. She thinks she is doing all she can on her diet and is willing to add another medicine. She uses [facility]. Her Triglycerides and LDL have both increased. Dr. Sanda Klein explained that the Triglyceride increase is probably due to the HgbA1c. Stay on the krill oil and start taking a whole pill of the Atorvastatin 40mg . We'll send in a new rx with new directions. Her kidney function, liver enzymes, and urine all look fabulous.

## 2014-12-11 DIAGNOSIS — I10 Essential (primary) hypertension: Secondary | ICD-10-CM | POA: Diagnosis not present

## 2014-12-11 DIAGNOSIS — E119 Type 2 diabetes mellitus without complications: Secondary | ICD-10-CM | POA: Diagnosis not present

## 2014-12-11 DIAGNOSIS — I70213 Atherosclerosis of native arteries of extremities with intermittent claudication, bilateral legs: Secondary | ICD-10-CM | POA: Diagnosis not present

## 2014-12-11 DIAGNOSIS — I739 Peripheral vascular disease, unspecified: Secondary | ICD-10-CM | POA: Diagnosis not present

## 2014-12-11 DIAGNOSIS — I6529 Occlusion and stenosis of unspecified carotid artery: Secondary | ICD-10-CM | POA: Diagnosis not present

## 2014-12-11 DIAGNOSIS — E785 Hyperlipidemia, unspecified: Secondary | ICD-10-CM | POA: Diagnosis not present

## 2014-12-11 DIAGNOSIS — F17200 Nicotine dependence, unspecified, uncomplicated: Secondary | ICD-10-CM | POA: Diagnosis not present

## 2014-12-11 DIAGNOSIS — M79609 Pain in unspecified limb: Secondary | ICD-10-CM | POA: Diagnosis not present

## 2014-12-14 ENCOUNTER — Telehealth: Payer: Self-pay | Admitting: Family Medicine

## 2014-12-14 NOTE — Telephone Encounter (Signed)
Pt called and said tradjenta is $494 and she would like to know if there is anything else she can take to replace this.

## 2014-12-14 NOTE — Telephone Encounter (Signed)
Routing to provider  

## 2014-12-16 ENCOUNTER — Encounter: Payer: Self-pay | Admitting: Family Medicine

## 2014-12-16 MED ORDER — SITAGLIPTIN PHOSPHATE 100 MG PO TABS: 100.0000 mg | ORAL_TABLET | Freq: Every day | ORAL | Status: DC

## 2014-12-16 NOTE — Telephone Encounter (Signed)
Patient notified

## 2014-12-16 NOTE — Telephone Encounter (Signed)
I sent in a new Rx which I hope will be less expensive

## 2014-12-17 ENCOUNTER — Other Ambulatory Visit: Payer: Self-pay | Admitting: Family Medicine

## 2014-12-17 MED ORDER — GLIMEPIRIDE 2 MG PO TABS: 2.0000 mg | ORAL_TABLET | Freq: Every day | ORAL | Status: DC

## 2014-12-21 ENCOUNTER — Telehealth: Payer: Self-pay

## 2014-12-21 DIAGNOSIS — Z789 Other specified health status: Secondary | ICD-10-CM | POA: Diagnosis not present

## 2014-12-21 DIAGNOSIS — M272 Inflammatory conditions of jaws: Secondary | ICD-10-CM | POA: Diagnosis not present

## 2014-12-21 NOTE — Telephone Encounter (Signed)
Patient notified, she was hesitant, said previously she had just gotten penicillian. I said since we can't see the severity of the swelling, it is our recommendation that she go to Urgent Care in [facility] or ED now.

## 2014-12-21 NOTE — Telephone Encounter (Signed)
That sounds serious She needs to go to urgent care or the ER right away; she might have facial cellulitis, erysipelas, might need IV antibiotics with facial swelling to the eye; have her go NOW

## 2014-12-21 NOTE — Telephone Encounter (Signed)
Patient has an abcessed tooth. Her face is swollen all the way up to her eye. She doesn't have a dentist here. Wants to know if you can call in a round of antibiotics.

## 2014-12-22 DIAGNOSIS — Z789 Other specified health status: Secondary | ICD-10-CM | POA: Diagnosis not present

## 2014-12-29 ENCOUNTER — Telehealth: Payer: Self-pay

## 2014-12-29 NOTE — Telephone Encounter (Signed)
According to [facility], it was ordered but for some reason it was not ran and/or resulted at the lab.

## 2014-12-29 NOTE — Telephone Encounter (Signed)
-----   Message from Arnetha Courser, MD sent at 12/28/2014  7:27 AM EST ----- Regarding: Missing CBC from 12/07/14 Can you see what happened to her CBC? Thanks! ----- Message -----    From: SYSTEM    Sent: 12/12/2014  12:04 AM      To: Arnetha Courser, MD

## 2014-12-29 NOTE — Telephone Encounter (Signed)
Patient notified to come in for CBC redraw. [place] but will come get it when she comes back in a week or 2.

## 2015-01-06 ENCOUNTER — Other Ambulatory Visit: Payer: Self-pay | Admitting: Family Medicine

## 2015-01-06 NOTE — Telephone Encounter (Signed)
Labs from last visit reviewed; rxs approved

## 2015-01-07 ENCOUNTER — Telehealth: Payer: Self-pay

## 2015-01-07 NOTE — Telephone Encounter (Signed)
Patient has been notified, but is out of the state and will come by once she gets back in town.

## 2015-02-12 ENCOUNTER — Other Ambulatory Visit: Payer: Self-pay | Admitting: Family Medicine

## 2015-02-12 NOTE — Telephone Encounter (Signed)
Last cr reviewed; rx approved 

## 2015-03-12 ENCOUNTER — Ambulatory Visit (INDEPENDENT_AMBULATORY_CARE_PROVIDER_SITE_OTHER): Payer: Medicare Other | Admitting: Family Medicine

## 2015-03-12 ENCOUNTER — Encounter: Payer: Self-pay | Admitting: Family Medicine

## 2015-03-12 VITALS — BP 100/69 | HR 71 | Temp 97.1°F | Ht 61.68 in | Wt 146.6 lb

## 2015-03-12 DIAGNOSIS — I6521 Occlusion and stenosis of right carotid artery: Secondary | ICD-10-CM | POA: Diagnosis not present

## 2015-03-12 DIAGNOSIS — E781 Pure hyperglyceridemia: Secondary | ICD-10-CM

## 2015-03-12 DIAGNOSIS — E1165 Type 2 diabetes mellitus with hyperglycemia: Secondary | ICD-10-CM | POA: Diagnosis not present

## 2015-03-12 DIAGNOSIS — G8929 Other chronic pain: Secondary | ICD-10-CM | POA: Diagnosis not present

## 2015-03-12 DIAGNOSIS — IMO0001 Reserved for inherently not codable concepts without codable children: Secondary | ICD-10-CM

## 2015-03-12 DIAGNOSIS — I739 Peripheral vascular disease, unspecified: Secondary | ICD-10-CM | POA: Diagnosis not present

## 2015-03-12 DIAGNOSIS — M545 Low back pain, unspecified: Secondary | ICD-10-CM | POA: Insufficient documentation

## 2015-03-12 DIAGNOSIS — Z720 Tobacco use: Secondary | ICD-10-CM | POA: Diagnosis not present

## 2015-03-12 DIAGNOSIS — I10 Essential (primary) hypertension: Secondary | ICD-10-CM | POA: Diagnosis not present

## 2015-03-12 DIAGNOSIS — I70245 Atherosclerosis of native arteries of left leg with ulceration of other part of foot: Secondary | ICD-10-CM | POA: Diagnosis not present

## 2015-03-12 DIAGNOSIS — Z5181 Encounter for therapeutic drug level monitoring: Secondary | ICD-10-CM

## 2015-03-12 DIAGNOSIS — L98499 Non-pressure chronic ulcer of skin of other sites with unspecified severity: Secondary | ICD-10-CM

## 2015-03-12 DIAGNOSIS — I70209 Unspecified atherosclerosis of native arteries of extremities, unspecified extremity: Secondary | ICD-10-CM | POA: Insufficient documentation

## 2015-03-12 MED ORDER — GLIMEPIRIDE 2 MG PO TABS: 1.0000 mg | ORAL_TABLET | Freq: Every day | ORAL | Status: DC

## 2015-03-12 MED ORDER — METOPROLOL TARTRATE 50 MG PO TABS: 25.0000 mg | ORAL_TABLET | Freq: Two times a day (BID) | ORAL | Status: DC

## 2015-03-12 NOTE — Assessment & Plan Note (Addendum)
Smoking is having horrible effect on her vascular system; I am so excited she is thinking about quitting; quitting smoking would be the best thing she could do at this point for her health; see AVS

## 2015-03-12 NOTE — Patient Instructions (Addendum)
Decrease the metoprolol to just half of a pill each dose but do take every twelve hours STOP the HCTZ completely Take the losartan every day, but if your pressure continues to be low, then let's decrease that to 50 mg daily; just call me Monitor your blood pressure and heart rate every day for the next week and call me next Friday  Call Dr. Lucky Cowboy TODAY and let him know that I'm worried about your circulation, and that you have ulcers on your toes and they are cool; I'd like you to see him next week; if he can't see you next week, please return to see me Tuesday or Wednesday to recheck your feet  Decrease your glimepiride to just half of a pill daily; if your sugars are still running low, then stop that pill altogether  You can have the xray of your back done today or Monday, and we'll refer you to see the physical therapist; if your back pain is not improving in the next few weeks, please call me and we can do further testing or send you to a back doctor  I do encourage you to quit smoking Call [phone] to sign up for smoking cessation classes You can call 1-800-QUIT-NOW to talk with a smoking cessation coach   Smoking Cessation, Tips for Success If you are ready to quit smoking, congratulations! You have chosen to help yourself be healthier. Cigarettes bring nicotine, tar, carbon monoxide, and other irritants into your body. Your lungs, heart, and blood vessels will be able to work better without these poisons. There are many different ways to quit smoking. Nicotine gum, nicotine patches, a nicotine inhaler, or nicotine nasal spray can help with physical craving. Hypnosis, support groups, and medicines help break the habit of smoking. WHAT THINGS CAN I DO TO MAKE QUITTING EASIER?  Here are some tips to help you quit for good:  Pick a date when you will quit smoking completely. Tell all of your friends and family about your plan to quit on that date.  Do not try to slowly cut down on the  number of cigarettes you are smoking. Pick a quit date and quit smoking completely starting on that day.  Throw away all cigarettes.   Clean and remove all ashtrays from your home, work, and car.  On a card, write down your reasons for quitting. Carry the card with you and read it when you get the urge to smoke.  Cleanse your body of nicotine. Drink enough water and fluids to keep your urine clear or pale yellow. Do this after quitting to flush the nicotine from your body.  Learn to predict your moods. Do not let a bad situation be your excuse to have a cigarette. Some situations in your life might tempt you into wanting a cigarette.  Never have "just one" cigarette. It leads to wanting another and another. Remind yourself of your decision to quit.  Change habits associated with smoking. If you smoked while driving or when feeling stressed, try other activities to replace smoking. Stand up when drinking your coffee. Brush your teeth after eating. Sit in a different chair when you read the paper. Avoid alcohol while trying to quit, and try to drink fewer caffeinated beverages. Alcohol and caffeine may urge you to smoke.  Avoid foods and drinks that can trigger a desire to smoke, such as sugary or spicy foods and alcohol.  Ask people who smoke not to smoke around you.  Have something planned to do  right after eating or having a cup of coffee. For example, plan to take a walk or exercise.  Try a relaxation exercise to calm you down and decrease your stress. Remember, you may be tense and nervous for the first 2 weeks after you quit, but this will pass.  Find new activities to keep your hands busy. Play with a pen, coin, or rubber band. Doodle or draw things on paper.  Brush your teeth right after eating. This will help cut down on the craving for the taste of tobacco after meals. You can also try mouthwash.   Use oral substitutes in place of cigarettes. Try using lemon drops, carrots,  cinnamon sticks, or chewing gum. Keep them handy so they are available when you have the urge to smoke.  When you have the urge to smoke, try deep breathing.  Designate your home as a nonsmoking area.  If you are a heavy smoker, ask your health care provider about a prescription for nicotine chewing gum. It can ease your withdrawal from nicotine.  Reward yourself. Set aside the cigarette money you save and buy yourself something nice.  Look for support from others. Join a support group or smoking cessation program. Ask someone at home or at work to help you with your plan to quit smoking.  Always ask yourself, "Do I need this cigarette or is this just a reflex?" Tell yourself, "Today, I choose not to smoke," or "I do not want to smoke." You are reminding yourself of your decision to quit.  Do not replace cigarette smoking with electronic cigarettes (commonly called e-cigarettes). The safety of e-cigarettes is unknown, and some may contain harmful chemicals.  If you relapse, do not give up! Plan ahead and think about what you will do the next time you get the urge to smoke. HOW WILL I FEEL WHEN I QUIT SMOKING? You may have symptoms of withdrawal because your body is used to nicotine (the addictive substance in cigarettes). You may crave cigarettes, be irritable, feel very hungry, cough often, get headaches, or have difficulty concentrating. The withdrawal symptoms are only temporary. They are strongest when you first quit but will go away within 10-14 days. When withdrawal symptoms occur, stay in control. Think about your reasons for quitting. Remind yourself that these are signs that your body is healing and getting used to being without cigarettes. Remember that withdrawal symptoms are easier to treat than the major diseases that smoking can cause.  Even after the withdrawal is over, expect periodic urges to smoke. However, these cravings are generally short lived and will go away whether you  smoke or not. Do not smoke! WHAT RESOURCES ARE AVAILABLE TO HELP ME QUIT SMOKING? Your health care provider can direct you to community resources or hospitals for support, which may include:  Group support.  Education.  Hypnosis.  Therapy.   This information is not intended to replace advice given to you by your health care provider. Make sure you discuss any questions you have with your health care provider.   Document Released: 11/05/2003 Document Revised: 02/27/2014 Document Reviewed: 07/25/2012 Elsevier Interactive Patient Education [employer].

## 2015-03-12 NOTE — Assessment & Plan Note (Addendum)
With superficial ulceration of pads of two toes; stressed to patient my concern, and asked her to please call Dr. Lucky Cowboy TODAY and get back in to see him right away; let him know my concern, physical findings of ulcers, as I think she is going to need revascularization; keep a close eye on the toes, urged her to quit smoking; avoid ice

## 2015-03-12 NOTE — Assessment & Plan Note (Signed)
Check lipids today 

## 2015-03-12 NOTE — Assessment & Plan Note (Signed)
Dr. Lucky Cowboy to rescan soon; check lipids, goal LDL under 70

## 2015-03-12 NOTE — Assessment & Plan Note (Addendum)
Stop the HCTZ today; take the beta-blocker every twelve hours; explained how her medicines work, beta-blocker does not last long enough for her to alternate and take like she is taking it; decresae the losartan if needed to 50 mg; patient to monitor BP and pulse and call next week to see if still controlled or if further adjustments needed

## 2015-03-12 NOTE — Progress Notes (Signed)
BP 100/69 mmHg  Pulse 71  Temp(Src) 97.1 F (36.2 C)  Ht 5' 1.67" (1.567 m)  Wt 146 lb 9.6 oz (66.497 kg)  BMI 27.08 kg/m2  SpO2 99%   Subjective:    Patient ID: Julia Cunningham, female    DOB: October 03, 1945, 70 y.o.   MRN: [id number]  HPI: Julia Cunningham is a 70 y.o. female  Chief Complaint  Patient presents with  . Follow-up  . Diabetes  . Back Pain    Lower back pain.   She has been keeping track of her diabetes; sometimes below 100; 65 one time and she felt bad, felt funny She is trying to eat better  She says her blood pressure has been really good; she is taking her metoprolol just once a day instead of BID, and she's alternating morning and evening; taking cholesterol medicine every other night; also taking losartan every other day  She sees Dr. Lucky Cowboy for peripheral vascular disease and carotid disease; she has claudication, walking and has to stop; mother had same thing She is taking statin every other night  Smoking 1 ppd and getting ready to quit; wants to try nicoderm CQ  She has been having problems for years with her back; got hurt on the job years ago, and that's when it all started; did therapy, went to doctors; was diagnosed with arhtritis; hurting in the lower back; there is burning and going down the leg; that is old, not new; going on for years; a few times, though, it went across the middle of the back; she can't clean the house like she used to, has to stop because her back hurts; getting to the point where it is annoying; no B/B dysfunction; the legs do not feel heavy, just so weak; knocks the wind out of her; she notices it when fixing the bed, vacuuming; reaching up high can cause burning across the lower back; last xrays were years ago, 7 or more years ago  Relevant past medical, surgical, family and social history reviewed and updated as indicated. Interim medical history since our last visit reviewed. Allergies and medications reviewed and updated.  Review of  Systems Per HPI unless specifically indicated above     Objective:    BP 100/69 mmHg  Pulse 71  Temp(Src) 97.1 F (36.2 C)  Ht 5' 1.67" (1.567 m)  Wt 146 lb 9.6 oz (66.497 kg)  BMI 27.08 kg/m2  SpO2 99%  Wt Readings from Last 3 Encounters:  03/12/15 146 lb 9.6 oz (66.497 kg)  12/07/14 147 lb (66.679 kg)  12/07/14 147 lb (66.679 kg)    Physical Exam  Constitutional: She appears well-developed and well-nourished. No distress.  HENT:  Head: Normocephalic and atraumatic.  Eyes: EOM are normal. No scleral icterus.  Neck: Carotid bruit is present (right). No thyromegaly present.  Cardiovascular: Normal rate, regular rhythm and normal heart sounds.   No murmur heard. Trace d pedis pulses; distal aspects of both feet are cool, erythematous and blanching; no violaceous or purplish  Pulmonary/Chest: Effort normal and breath sounds normal. No respiratory distress. She has no wheezes.  Abdominal: Soft. Bowel sounds are normal. She exhibits no distension.  Musculoskeletal: Normal range of motion. She exhibits no edema.  Neurological: She is alert. She exhibits normal muscle tone.  Skin: Skin is warm and dry. She is not diaphoretic. No pallor.  Psychiatric: She has a normal mood and affect. Her behavior is normal. Judgment and thought content normal. She does not exhibit a depressed mood.  Diabetic Foot Form - Detailed   Diabetic Foot Exam - detailed  Diabetic Foot exam was performed with the following findings:  Yes 03/12/2015  6:36 PM  Visual Foot Exam completed.:  Yes  Are the shoes appropriate in style and fit?:  Yes  Are the toenails long?:  No  Are the toenails thick?:  No  Do you have pain in calf while walking?:  Yes  Are the toenails ingrown?:  No  Normal Range of Motion:  Yes    Pulse Foot Exam completed.:  Yes  Right Dorsalis Pedis:  Diminished Left Dorsalis Pedis:  Diminished  Sensory Foot Exam Completed.:  Yes  Swelling:  No  Semmes-Weinstein Monofilament Test  R  Site 1-Great Toe:  Pos L Site 1-Great Toe:  Pos  R Site 4:  Pos L Site 4:  Pos  R Site 5:  Pos L Site 5:  Pos    Comments:  Ulcerations (superficial) pads of third and fourth toes of the left foot; no drainage; distal aspects of both feet cools ot the touch with discoloration, blanching; no purplish or dark coloration        Assessment & Plan:   Problem List Items Addressed This Visit      Cardiovascular and Mediastinum   Atherosclerosis of right carotid artery    Dr. Lucky Cowboy to rescan soon; check lipids, goal LDL under 70      Relevant Medications   metoprolol (LOPRESSOR) 50 MG tablet   Other Relevant Orders   Lipid Panel w/o Chol/HDL Ratio (Completed)   Essential hypertension, benign    Stop the HCTZ today; take the beta-blocker every twelve hours; explained how her medicines work, beta-blocker does not last long enough for her to alternate and take like she is taking it; decresae the losartan if needed to 50 mg; patient to monitor BP and pulse and call next week to see if still controlled or if further adjustments needed      Relevant Medications   metoprolol (LOPRESSOR) 50 MG tablet   Peripheral vascular disease (HCC)    With superficial ulceration of pads of two toes; stressed to patient my concern, and asked her to please call Dr. 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)   Relevant Medications   metoprolol (LOPRESSOR) 50 MG tablet     Other   Hypertriglyceridemia    Check lipids today      Relevant Medications   metoprolol (LOPRESSOR) 50 MG tablet    Other Relevant Orders   Lipid Panel w/o Chol/HDL Ratio (Completed)   Tobacco abuse    Smoking is having horrible effect on her vascular system; I am so excited she is thinking about quitting; quitting smoking would be the best thing she could do at this point for her health; see AVS      Uncontrolled diabetes mellitus type 2 without complications (HCC) - Primary    Decrease the glimepiride from 2 mg to 1mg  daily; if sugars are still low, stop it altogether; so glad patient is eating better; check A1c today  Relevant Medications   glimepiride (AMARYL) 2 MG tablet   Other Relevant Orders   Microalbumin / creatinine urine ratio (Completed)   Hgb A1c w/o eAG (Completed)   Medication monitoring encounter   Relevant Orders   CBC with Differential/Platelet (Completed)   Comprehensive metabolic panel (Completed)   Chronic bilateral low back pain    See HPI; will start with imaging and physical therapy; refer to back specialist or get MRI to r/o spinal stenosis if not improving; see AVS      Relevant Orders   DG Lumbar Spine Complete   Ambulatory referral to Physical Therapy   Claudication [facility])    Patient was instructed to call her vascular doctor today to explain her symptoms and get back in to see him quickly; she will likely need revascularization; if toe or foot goes black or purple or cold, got to ER         Follow up plan: Return in about 3 months (around 06/10/2015) for thirty minute follow-up with fasting labs; see me Tues or Wed if Dr. Lucky Cowboy can't see you.  An after-visit summary was printed and given to the patient at [place].  Please see the patient instructions which may contain other information and recommendations beyond what is mentioned above in the assessment and plan.  Orders Placed This Encounter  Procedures  . DG Lumbar Spine Complete  . Lipid Panel w/o Chol/HDL Ratio  . CBC with Differential/Platelet  . Comprehensive metabolic panel  . Microalbumin /  creatinine urine ratio  . Hgb A1c w/o eAG  . Ambulatory referral to Physical Therapy   Meds ordered this encounter  Medications  . glimepiride (AMARYL) 2 MG tablet    Sig: Take 0.5 tablets (1 mg total) by mouth daily before breakfast.    Dispense:  45 tablet    Refill:  1    CANCEL the other whole pill Rx; new instructions  . metoprolol (LOPRESSOR) 50 MG tablet    Sig: Take 0.5 tablets (25 mg total) by mouth 2 (two) times daily.    Dispense:  90 tablet    Refill:  1    We're decreasing the dose, just 25 mg BID now   Medications Discontinued During This Encounter  Medication Reason  . glimepiride (AMARYL) 2 MG tablet Reorder  . hydrochlorothiazide (HYDRODIURIL) 12.5 MG tablet Discontinued by provider  . metoprolol (LOPRESSOR) 50 MG tablet Reorder

## 2015-03-12 NOTE — Assessment & Plan Note (Signed)
Decrease the glimepiride from 2 mg to 1mg  daily; if sugars are still low, stop it altogether; so glad patient is eating better; check A1c today

## 2015-03-13 LAB — COMPREHENSIVE METABOLIC PANEL
ALK PHOS: 74 IU/L (ref 39–117)
ALT: 9 IU/L (ref 0–32)
AST: 13 IU/L (ref 0–40)
Albumin/Globulin Ratio: 1.3 (ref 1.1–2.5)
Albumin: 4.1 g/dL (ref 3.6–4.8)
BUN/Creatinine Ratio: 25 (ref 11–26)
BUN: 19 mg/dL (ref 8–27)
CHLORIDE: 100 mmol/L (ref 96–106)
CO2: 22 mmol/L (ref 18–29)
CREATININE: 0.77 mg/dL (ref 0.57–1.00)
Calcium: 9.7 mg/dL (ref 8.7–10.3)
GFR calc Af Amer: 92 mL/min/{1.73_m2} (ref >59)
GFR calc non Af Amer: 80 mL/min/{1.73_m2} (ref >59)
GLOBULIN, TOTAL: 3.1 g/dL (ref 1.5–4.5)
Glucose: 159 mg/dL — ABNORMAL HIGH (ref 65–99)
POTASSIUM: 4.3 mmol/L (ref 3.5–5.2)
SODIUM: 140 mmol/L (ref 134–144)
Total Protein: 7.2 g/dL (ref 6.0–8.5)

## 2015-03-13 LAB — CBC WITH DIFFERENTIAL/PLATELET
BASOS ABS: 0 10*3/uL (ref 0.0–0.2)
Basos: 0 %
EOS (ABSOLUTE): 0.2 10*3/uL (ref 0.0–0.4)
EOS: 1 %
HEMATOCRIT: 30.6 % — AB (ref 34.0–46.6)
Hemoglobin: 9.4 g/dL — ABNORMAL LOW (ref 11.1–15.9)
Immature Grans (Abs): 0 10*3/uL (ref 0.0–0.1)
Immature Granulocytes: 0 %
LYMPHS ABS: 2 10*3/uL (ref 0.7–3.1)
Lymphs: 16 %
MCH: 24 pg — AB (ref 26.6–33.0)
MCHC: 30.7 g/dL — AB (ref 31.5–35.7)
MCV: 78 fL — ABNORMAL LOW (ref 79–97)
MONOS ABS: 0.6 10*3/uL (ref 0.1–0.9)
Monocytes: 4 %
NEUTROS ABS: 9.9 10*3/uL — AB (ref 1.4–7.0)
Neutrophils: 79 %
Platelets: 470 10*3/uL — ABNORMAL HIGH (ref 150–379)
RBC: 3.91 x10E6/uL (ref 3.77–5.28)
RDW: 14.3 % (ref 12.3–15.4)
WBC: 12.7 10*3/uL — AB (ref 3.4–10.8)

## 2015-03-13 LAB — LIPID PANEL W/O CHOL/HDL RATIO
CHOLESTEROL TOTAL: 150 mg/dL (ref 100–199)
HDL: 35 mg/dL — AB (ref >39)
LDL CALC: 78 mg/dL (ref 0–99)
Triglycerides: 183 mg/dL — ABNORMAL HIGH (ref 0–149)
VLDL Cholesterol Cal: 37 mg/dL (ref 5–40)

## 2015-03-13 LAB — MICROALBUMIN / CREATININE URINE RATIO
CREATININE, UR: 145.5 mg/dL
MICROALB/CREAT RATIO: 20.7 mg/g creat (ref 0.0–30.0)
MICROALBUM., U, RANDOM: 30.1 ug/mL

## 2015-03-13 LAB — HGB A1C W/O EAG: Hgb A1c MFr Bld: 6.6 % — ABNORMAL HIGH (ref 4.8–5.6)

## 2015-03-14 NOTE — Assessment & Plan Note (Signed)
Patient was instructed to call her vascular doctor today to explain her symptoms and get back in to see him quickly; she will likely need revascularization; if toe or foot goes black or purple or cold, got to ER

## 2015-03-14 NOTE — Assessment & Plan Note (Signed)
See HPI; will start with imaging and physical therapy; refer to back specialist or get MRI to r/o spinal stenosis if not improving; see AVS

## 2015-03-15 ENCOUNTER — Ambulatory Visit
Admission: RE | Admit: 2015-03-15 | Discharge: 2015-03-15 | Disposition: A | Discharge status: Home / Self Care | Payer: Medicare Other | Source: Ambulatory Visit | Attending: Family Medicine | Admitting: Family Medicine

## 2015-03-15 ENCOUNTER — Telehealth: Payer: Self-pay | Admitting: Family Medicine

## 2015-03-15 DIAGNOSIS — G8929 Other chronic pain: Secondary | ICD-10-CM | POA: Insufficient documentation

## 2015-03-15 DIAGNOSIS — M545 Low back pain: Secondary | ICD-10-CM | POA: Insufficient documentation

## 2015-03-15 DIAGNOSIS — D509 Iron deficiency anemia, unspecified: Secondary | ICD-10-CM

## 2015-03-15 NOTE — Assessment & Plan Note (Signed)
Refer to GI 

## 2015-03-15 NOTE — Telephone Encounter (Signed)
I talked with patient's husband, explained calling about lab results; she's getting xrays, so I'll call back later today

## 2015-03-15 NOTE — Telephone Encounter (Signed)
I talked with patient; she forgot to call Dr. Lucky Cowboy; no worse; please do call Dr. Lucky Cowboy Take atorvastatin 40 mg every night Anemia; explained my concern, referred for colonoscopy A1c 6.6, great work

## 2015-03-16 ENCOUNTER — Encounter: Payer: Self-pay | Admitting: Family Medicine

## 2015-03-16 NOTE — Telephone Encounter (Signed)
[facility] staff -- please schedule patient with Dr. Vira Agar and call her about the switch I do recommend she get foods rich in iron (lentils, spinach, kale, kidney beans, etc.)

## 2015-03-19 ENCOUNTER — Telehealth: Payer: Self-pay

## 2015-03-19 ENCOUNTER — Encounter: Payer: Self-pay | Admitting: Family Medicine

## 2015-03-19 DIAGNOSIS — D649 Anemia, unspecified: Secondary | ICD-10-CM

## 2015-03-19 DIAGNOSIS — M545 Low back pain, unspecified: Secondary | ICD-10-CM

## 2015-03-19 NOTE — Telephone Encounter (Signed)
Patient notified

## 2015-03-19 NOTE — Telephone Encounter (Signed)
Called patient to let her know she has an appointment at [facility] with Denice Paradise Tuesday 03/23/2015 at 1:30.  She did not answer and I left voicemail for patient to return call.

## 2015-03-19 NOTE — Telephone Encounter (Signed)
Let's have pt come back in for CBC and B12 and iron and ferritin studies Make sure stool cards are being done/have been done and GI referral is underway ASAP

## 2015-03-20 ENCOUNTER — Encounter: Payer: Self-pay | Admitting: Emergency Medicine

## 2015-03-20 ENCOUNTER — Emergency Department: Payer: Medicare Other

## 2015-03-20 ENCOUNTER — Emergency Department
Admission: EM | Admit: 2015-03-20 | Discharge: 2015-03-21 | Disposition: A | Discharge status: Home / Self Care | Payer: Medicare Other | Attending: Emergency Medicine | Admitting: Emergency Medicine

## 2015-03-20 DIAGNOSIS — I739 Peripheral vascular disease, unspecified: Secondary | ICD-10-CM

## 2015-03-20 DIAGNOSIS — Z7902 Long term (current) use of antithrombotics/antiplatelets: Secondary | ICD-10-CM | POA: Diagnosis not present

## 2015-03-20 DIAGNOSIS — R1030 Lower abdominal pain, unspecified: Secondary | ICD-10-CM

## 2015-03-20 DIAGNOSIS — I10 Essential (primary) hypertension: Secondary | ICD-10-CM | POA: Insufficient documentation

## 2015-03-20 DIAGNOSIS — N390 Urinary tract infection, site not specified: Secondary | ICD-10-CM | POA: Insufficient documentation

## 2015-03-20 DIAGNOSIS — G8929 Other chronic pain: Secondary | ICD-10-CM | POA: Insufficient documentation

## 2015-03-20 DIAGNOSIS — F17210 Nicotine dependence, cigarettes, uncomplicated: Secondary | ICD-10-CM | POA: Diagnosis not present

## 2015-03-20 DIAGNOSIS — N200 Calculus of kidney: Secondary | ICD-10-CM | POA: Diagnosis not present

## 2015-03-20 DIAGNOSIS — Z7982 Long term (current) use of aspirin: Secondary | ICD-10-CM | POA: Diagnosis not present

## 2015-03-20 DIAGNOSIS — Z79899 Other long term (current) drug therapy: Secondary | ICD-10-CM | POA: Diagnosis not present

## 2015-03-20 DIAGNOSIS — Z7984 Long term (current) use of oral hypoglycemic drugs: Secondary | ICD-10-CM | POA: Insufficient documentation

## 2015-03-20 DIAGNOSIS — E119 Type 2 diabetes mellitus without complications: Secondary | ICD-10-CM | POA: Insufficient documentation

## 2015-03-20 LAB — COMPREHENSIVE METABOLIC PANEL
ALBUMIN: 3.9 g/dL (ref 3.5–5.0)
ALT: 12 U/L — ABNORMAL LOW (ref 14–54)
ANION GAP: 10 (ref 5–15)
AST: 18 U/L (ref 15–41)
Alkaline Phosphatase: 65 U/L (ref 38–126)
BUN: 15 mg/dL (ref 6–20)
CHLORIDE: 102 mmol/L (ref 101–111)
CO2: 24 mmol/L (ref 22–32)
Calcium: 9.3 mg/dL (ref 8.9–10.3)
Creatinine, Ser: 0.79 mg/dL (ref 0.44–1.00)
Glucose, Bld: 127 mg/dL — ABNORMAL HIGH (ref 65–99)
Potassium: 4.3 mmol/L (ref 3.5–5.1)
SODIUM: 136 mmol/L (ref 135–145)
Total Bilirubin: 0.6 mg/dL (ref 0.3–1.2)
Total Protein: 8.1 g/dL (ref 6.5–8.1)

## 2015-03-20 LAB — CBC
HEMATOCRIT: 30.6 % — AB (ref 35.0–47.0)
Hemoglobin: 9.8 g/dL — ABNORMAL LOW (ref 12.0–16.0)
MCH: 23.8 pg — ABNORMAL LOW (ref 26.0–34.0)
MCHC: 32 g/dL (ref 32.0–36.0)
MCV: 74.4 fL — ABNORMAL LOW (ref 80.0–100.0)
PLATELETS: 503 10*3/uL — AB (ref 150–440)
RBC: 4.12 MIL/uL (ref 3.80–5.20)
RDW: 14.5 % (ref 11.5–14.5)
WBC: 15.5 10*3/uL — ABNORMAL HIGH (ref 3.6–11.0)

## 2015-03-20 LAB — URINALYSIS COMPLETE WITH MICROSCOPIC (ARMC ONLY)
Bilirubin Urine: NEGATIVE
Glucose, UA: NEGATIVE mg/dL
Hgb urine dipstick: NEGATIVE
KETONES UR: NEGATIVE mg/dL
NITRITE: NEGATIVE
PROTEIN: 30 mg/dL — AB
Specific Gravity, Urine: 1.019 (ref 1.005–1.030)
pH: 7 (ref 5.0–8.0)

## 2015-03-20 LAB — LIPASE, BLOOD: Lipase: 41 U/L (ref 11–51)

## 2015-03-20 MED ORDER — OXYCODONE-ACETAMINOPHEN 5-325 MG PO TABS: 2.0000 | ORAL_TABLET | Freq: Four times a day (QID) | ORAL | Status: DC | PRN

## 2015-03-20 MED ORDER — OXYCODONE-ACETAMINOPHEN 5-325 MG PO TABS
2.0000 | ORAL_TABLET | Freq: Once | ORAL | Status: AC
  Administered 2015-03-20: 2 via ORAL
  Filled 2015-03-20: qty 2

## 2015-03-20 MED ORDER — POLYETHYLENE GLYCOL 3350 17 G PO PACK: 17.0000 g | PACK | Freq: Every day | ORAL | Status: DC

## 2015-03-20 MED ORDER — SODIUM CHLORIDE 0.9 % IV SOLN
1000.0000 mL | Freq: Once | INTRAVENOUS | Status: AC
  Administered 2015-03-20: 1000 mL via INTRAVENOUS

## 2015-03-20 MED ORDER — ONDANSETRON HCL 4 MG/2ML IJ SOLN
4.0000 mg | Freq: Once | INTRAMUSCULAR | Status: AC
  Administered 2015-03-20: 4 mg via INTRAVENOUS
  Filled 2015-03-20: qty 2

## 2015-03-20 MED ORDER — CIPROFLOXACIN HCL 500 MG PO TABS: 500.0000 mg | ORAL_TABLET | Freq: Two times a day (BID) | ORAL | Status: AC

## 2015-03-20 MED ORDER — IOHEXOL 240 MG/ML SOLN
25.0000 mL | Freq: Once | INTRAMUSCULAR | Status: AC | PRN
  Administered 2015-03-20: 25 mL via ORAL

## 2015-03-20 MED ORDER — MORPHINE SULFATE (PF) 4 MG/ML IV SOLN
4.0000 mg | Freq: Once | INTRAVENOUS | Status: AC
  Administered 2015-03-20: 4 mg via INTRAVENOUS
  Filled 2015-03-20: qty 1

## 2015-03-20 MED ORDER — IOHEXOL 300 MG/ML  SOLN
100.0000 mL | Freq: Once | INTRAMUSCULAR | Status: AC | PRN
  Administered 2015-03-20: 100 mL via INTRAVENOUS

## 2015-03-20 NOTE — ED Provider Notes (Signed)
[facility] Emergency Department Provider Note  ____________________________________________    I have reviewed the triage vital signs and the nursing notes.   HISTORY  Chief Complaint Abdominal Pain   HPI Julia Cunningham is a 70 y.o. female who presents with complaints of moderate cramping lower abdominal pain that started yesterday after eating dinner. She complains of chronic back pain as well but she does not think that is related. She does complain of feeling mildly nauseated. She recent saw her PCP for regular checkup who diagnosed her with iron deficiency anemia. Patient states her stools than normal. She denies dysuria or hematuria. No fevers no chills. No myalgias. No recent sick contacts. No recent travel.     Past Medical History  Diagnosis Date  . Hypertension   . Hypertriglyceridemia   . Atherosclerosis of right carotid artery   . Tobacco abuse   . Diabetes mellitus without complication ([place])   . GI bleed not requiring more than 4 units of blood in 24 hours, ICU, or surgery Aug 15-17, 2015    2 units PRBCs  . Aortic atherosclerosis ([place])   . Vertebral artery occlusion   . Breast cancer ([place])     Breast left   . Collagen vascular disease [facility])     Patient Active Problem List   Diagnosis Date Noted  . Microcytic hypochromic anemia 03/15/2015  . Chronic bilateral low back pain 03/12/2015  . Atherosclerotic peripheral vascular disease with ulceration ([place]) 03/12/2015  . Claudication ([place]) 03/12/2015  . Medication monitoring encounter 12/07/2014  . Essential hypertension, benign 05/23/2014  . Cardiac murmur 05/23/2014  . Peripheral vascular disease ([place]) 05/23/2014  . Proteinuria 05/23/2014  . Hypertriglyceridemia   . Atherosclerosis of right carotid artery   . Tobacco abuse   . Breast cancer ([place])   . Uncontrolled diabetes mellitus type 2 without complications ([place])   . Aortic atherosclerosis ([place])   . Vertebral artery occlusion      Past Surgical History  Procedure Laterality Date  . Cholecystectomy  02/20/1978  . Breast surgery Left 02/21/1988    Mastectomy  . Carotid endarterectomy Left July 2015    Current Outpatient Rx  Name  Route  Sig  Dispense  Refill  . aspirin EC 81 MG tablet   Oral   Take 81 mg by mouth daily.         Marland Kitchen atorvastatin (LIPITOR) 40 MG tablet   Oral   Take 1 tablet (40 mg total) by mouth at bedtime.   90 tablet   1     Pharmacist -- use THIS one, not the half of a pill ...   . clopidogrel (PLAVIX) 75 MG tablet   Oral   Take 75 mg by mouth daily.         Marland Kitchen glimepiride (AMARYL) 2 MG tablet   Oral   Take 0.5 tablets (1 mg total) by mouth daily before breakfast.   45 tablet   1     CANCEL the other whole pill Rx; new instructions   . glucose blood (TRUETEST TEST) test strip      Check fingerstick sugars once a day on average; Dx E11.9; LON 99 months   100 each   1   . Lancets 30G MISC      Reli-On brand requested by patient; check fingerstick sugars once a day on average; Dx E11.9   100 each   1   . losartan (COZAAR) 100 MG tablet      TAKE  1 TABLET EVERY DAY   90 tablet   0   . metFORMIN (GLUCOPHAGE-XR) 500 MG 24 hr tablet      TAKE 4 TABLETS EVERY EVENING   360 tablet   1   . metoprolol (LOPRESSOR) 50 MG tablet   Oral   Take 0.5 tablets (25 mg total) by mouth 2 (two) times daily.   90 tablet   1     We're decreasing the dose, just 25 mg BID now   . pantoprazole (PROTONIX) 40 MG tablet   Oral   Take 40 mg by mouth daily.         Marland Kitchen triamcinolone cream (KENALOG) 0.1 %   Topical   Apply 1 application topically daily as needed (external ears).           Allergies Review of patient's allergies indicates no known allergies.  Family History  Problem Relation Age of Onset  . Heart disease Mother     in her 74's  . Stroke Mother     10 yo  . Hypertension Mother   . Heart attack Mother   . Heart disease Father     80 yo  . Diabetes  Father   . Asthma Father   . Heart attack Father   . Hypertension Father   . Cancer Neg Hx   . COPD Neg Hx     Social History Social History  Substance Use Topics  . Smoking status: Current Every Day Smoker -- 1.00 packs/day for 50 years    Types: Cigarettes  . Smokeless tobacco: Never Used  . Alcohol Use: No    Review of Systems  Constitutional: Negative for fever. Eyes: Negative for blurry vision ENT: Negative for sore throat Cardiovascular: Negative for chest pain. Respiratory: Negative for shortness of breath. Negative for cough Gastrointestinal: As above normal stools Genitourinary: Negative for dysuria. Musculoskeletal: Negative for back pain. Skin: Negative for rash. Neurological: Negative for headaches or focal weakness Psychiatric: No anxiety    ____________________________________________   PHYSICAL EXAM:  VITAL SIGNS: ED Triage Vitals  Enc Vitals Group     BP 03/20/15 2120 221/85 mmHg     Pulse Rate 03/20/15 2117 87     Resp 03/20/15 2117 18     Temp 03/20/15 2117 98 F (36.7 C)     Temp src --      SpO2 03/20/15 2117 100 %     Weight 03/20/15 2117 143 lb (64.864 kg)     Height 03/20/15 2117 5\' 1"  (1.549 m)     Head Cir --      Peak Flow --      Pain Score 03/20/15 2118 10     Pain Loc --      Pain Edu? --      Excl. in [place]? --      Constitutional: Alert and oriented. Well appearing and in no distress. Eyes: Conjunctivae are normal.  ENT   Head: Normocephalic and atraumatic.   Mouth/Throat: Mucous membranes are moist. Cardiovascular: Normal rate, regular rhythm. Normal and symmetric distal pulses are present in all extremities.  Respiratory: Normal respiratory effort without tachypnea nor retractions. Breath sounds are clear and equal bilaterally.  Gastrointestinal: Mild tenderness to palpation around the umbilicus and left lower quadrant. No distention. There is no CVA tenderness. Genitourinary: deferred Musculoskeletal: Nontender  with normal range of motion in all extremities. No lower extremity tenderness nor edema. Neurologic:  Normal speech and language. No gross focal neurologic deficits are appreciated.  Skin:  Skin is warm, dry and intact. No rash noted. Psychiatric: Mood and affect are normal. Patient exhibits appropriate insight and judgment.  ____________________________________________    LABS (pertinent positives/negatives)  Labs Reviewed  LIPASE, BLOOD  COMPREHENSIVE METABOLIC PANEL  CBC  URINALYSIS COMPLETEWITH MICROSCOPIC ([place])    ____________________________________________   EKG ED ECG REPORT I, Lavonia Drafts, the attending physician, personally viewed and interpreted this ECG.  Date: 03/20/2015 EKG Time: 9:25 PM Rate: 89 Rhythm: normal sinus rhythm QRS Axis: normal Intervals: normal ST/T Wave abnormalities: normal Conduction Disturbances: none Narrative Interpretation: unremarkable   ____________________________________________    RADIOLOGY I have personally reviewed any xrays that were ordered on this patient: CT abdomen pelvis pending  ____________________________________________   PROCEDURES  Procedure(s) performed: none  Critical Care performed: none  ____________________________________________   INITIAL IMPRESSION / ASSESSMENT AND PLAN / ED COURSE  Pertinent labs & imaging results that were available during my care of the patient were reviewed by me and considered in my medical decision making (see chart for details).  Patient presents with complaints of abdominal pain which is been constant since after dinner yesterday. She reports the pain is moderate and at times severe. She has never had this pain before. We will check labs, urine and consider CT abdomen pelvis for further evaluation. We will give morphine 4 mg and Zofran 4 mg IV in the emergency department. I suspect her elevated blood pressure is related to pain but we will follow this after  analgesia is given  ____________________________________________   FINAL CLINICAL IMPRESSION(S) / ED DIAGNOSES  Final diagnoses:  Lower abdominal pain     Lavonia Drafts, MD 03/20/15 2259

## 2015-03-20 NOTE — ED Notes (Signed)
Patient states that she has lower abd pain that is radiating to her lower back that started yesterday. Patient states that she was seen by her pcp on Monday and was told that her hgb was 9 and has a history of GI bleed. Patient denies nausea, vomiting or urinary symptoms.

## 2015-03-20 NOTE — Discharge Instructions (Signed)
Abdominal Pain, Adult °Many things can cause abdominal pain. Usually, abdominal pain is not caused by a disease and will improve without treatment. It can often be observed and treated at home. Your health care provider will do a physical exam and possibly order blood tests and X-rays to help determine the seriousness of your pain. However, in many cases, more time must pass before a clear cause of the pain can be found. Before that point, your health care provider may not know if you need more testing or further treatment. °HOME CARE INSTRUCTIONS °Monitor your abdominal pain for any changes. The following actions may help to alleviate any discomfort you are experiencing: °· Only take over-the-counter or prescription medicines as directed by your health care provider. °· Do not take laxatives unless directed to do so by your health care provider. °· Try a clear liquid diet (broth, tea, or water) as directed by your health care provider. Slowly move to a bland diet as tolerated. °SEEK MEDICAL CARE IF: °· You have unexplained abdominal pain. °· You have abdominal pain associated with nausea or diarrhea. °· You have pain when you urinate or have a bowel movement. °· You experience abdominal pain that wakes you in the night. °· You have abdominal pain that is worsened or improved by eating food. °· You have abdominal pain that is worsened with eating fatty foods. °· You have a fever. °SEEK IMMEDIATE MEDICAL CARE IF: °· Your pain does not go away within 2 hours. °· You keep throwing up (vomiting). °· Your pain is felt only in portions of the abdomen, such as the right side or the left lower portion of the abdomen. °· You pass bloody or black tarry stools. °MAKE SURE YOU: °· Understand these instructions. °· Will watch your condition. °· Will get help right away if you are not doing well or get worse. °  °This information is not intended to replace advice given to you by your health care provider. Make sure you discuss  any questions you have with your health care provider. °  °Document Released: 11/16/2004 Document Revised: 10/28/2014 Document Reviewed: 10/16/2012 °Elsevier Interactive Patient Education ©2016 [employer] °Urinary Tract Infection °Urinary tract infections (UTIs) can develop anywhere along your urinary tract. Your urinary tract is your body's drainage system for removing wastes and extra water. Your urinary tract includes two kidneys, two ureters, a bladder, and a urethra. Your kidneys are a pair of bean-shaped organs. Each kidney is about the size of your fist. They are located below your ribs, one on each side of your spine. °CAUSES °Infections are caused by microbes, which are microscopic organisms, including fungi, viruses, and bacteria. These organisms are so small that they can only be seen through a microscope. Bacteria are the microbes that most commonly cause UTIs. °SYMPTOMS  °Symptoms of UTIs may vary by age and gender of the patient and by the location of the infection. Symptoms in young women typically include a frequent and intense urge to urinate and a painful, burning feeling in the bladder or urethra during urination. Older women and men are more likely to be tired, shaky, and weak and have muscle aches and abdominal pain. A fever may mean the infection is in your kidneys. Other symptoms of a kidney infection include pain in your back or sides below the ribs, nausea, and vomiting. °DIAGNOSIS °To diagnose a UTI, your caregiver will ask you about your symptoms. Your caregiver will also ask you to provide a urine sample. The   urine sample will be tested for bacteria and white blood cells. White blood cells are made by your body to help fight infection. °TREATMENT  °Typically, UTIs can be treated with medication. Because most UTIs are caused by a bacterial infection, they usually can be treated with the use of antibiotics. The choice of antibiotic and length of treatment depend on your symptoms and the  type of bacteria causing your infection. °HOME CARE INSTRUCTIONS °· If you were prescribed antibiotics, take them exactly as your caregiver instructs you. Finish the medication even if you feel better after you have only taken some of the medication. °· Drink enough water and fluids to keep your urine clear or pale yellow. °· Avoid caffeine, tea, and carbonated beverages. They tend to irritate your bladder. °· Empty your bladder often. Avoid holding urine for long periods of time. °· Empty your bladder before and after sexual intercourse. °· After a bowel movement, women should cleanse from front to back. Use each tissue only once. °SEEK MEDICAL CARE IF:  °· You have back pain. °· You develop a fever. °· Your symptoms do not begin to resolve within 3 days. °SEEK IMMEDIATE MEDICAL CARE IF:  °· You have severe back pain or lower abdominal pain. °· You develop chills. °· You have nausea or vomiting. °· You have continued burning or discomfort with urination. °MAKE SURE YOU:  °· Understand these instructions. °· Will watch your condition. °· Will get help right away if you are not doing well or get worse. °  °This information is not intended to replace advice given to you by your health care provider. Make sure you discuss any questions you have with your health care provider. °  °Document Released: 11/16/2004 Document Revised: 10/28/2014 Document Reviewed: 03/17/2011 °Elsevier Interactive Patient Education ©2016 Elsevier Inc. ° °

## 2015-03-20 NOTE — ED Provider Notes (Addendum)
  IMPRESSION: 1. No acute abnormalities involving the abdomen or pelvis. 2. Nonobstructing 3 mm calculus in a mid calix of the left kidney. 3. Circumferential wall thickening involving the visualized distal esophagus extending to the esophagogastric junction. Query GE reflux disease though there is no evidence of hiatal hernia. 4. Descending and sigmoid colon diverticulosis without evidence of acute diverticulitis. 5. Severe generalized atherosclerosis with mixed calcified and noncalcified plaque. 6. 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 Possible hemodynamically significant stenosis involving the right common iliac artery. 7. Occlusion of the left common iliac artery with reconstitution of the external iliac artery via internal iliac collaterals.   On reexamination patient is nontender, she'll be discharged with Percocet, MiraLAX and Cipro. Patient is not in any acute distress, has known vascular disease as dictated above and has recently had an arteriogram and has seen vascular surgery. ABIs at this point particularly in the right leg are 0.77 Earleen Newport, MD 03/20/15 [id number]  Earleen Newport, MD 03/20/15 [phone]

## 2015-03-20 NOTE — ED Notes (Signed)
MD Williams at bedside.  

## 2015-03-20 NOTE — ED Notes (Signed)
MD Kinner notified of pt's BP of 202/80

## 2015-03-21 ENCOUNTER — Encounter: Payer: Self-pay | Admitting: Family Medicine

## 2015-03-21 NOTE — ED Notes (Signed)
Reviewed d/c instructions, pt's lab results, prescriptions, and follow-up care with patient. Pt verbalized understanding.

## 2015-03-22 ENCOUNTER — Ambulatory Visit: Payer: Medicare Other | Admitting: Physical Therapy

## 2015-04-11 ENCOUNTER — Telehealth: Payer: Self-pay | Admitting: Family Medicine

## 2015-04-11 NOTE — Telephone Encounter (Signed)
Please ask pt to schedule ER follow-up with me I'm concerned about her anemia, want to go over CT results with her please I thought she was going to have already been seen by now

## 2015-04-12 NOTE — Telephone Encounter (Signed)
Appt. 04/23/15 °

## 2015-04-14 ENCOUNTER — Other Ambulatory Visit: Payer: Self-pay | Admitting: Family Medicine

## 2015-04-14 DIAGNOSIS — E1142 Type 2 diabetes mellitus with diabetic polyneuropathy: Secondary | ICD-10-CM | POA: Diagnosis not present

## 2015-04-14 DIAGNOSIS — L97521 Non-pressure chronic ulcer of other part of left foot limited to breakdown of skin: Secondary | ICD-10-CM | POA: Diagnosis not present

## 2015-04-15 NOTE — Telephone Encounter (Signed)
Cr and K+ from Jan 2017 reviewed; Rx approved

## 2015-04-23 ENCOUNTER — Ambulatory Visit (INDEPENDENT_AMBULATORY_CARE_PROVIDER_SITE_OTHER): Payer: Medicare Other | Admitting: Family Medicine

## 2015-04-23 ENCOUNTER — Encounter: Payer: Self-pay | Admitting: Family Medicine

## 2015-04-23 VITALS — BP 173/80 | HR 79 | Temp 98.9°F | Ht 61.5 in | Wt 149.0 lb

## 2015-04-23 DIAGNOSIS — D649 Anemia, unspecified: Secondary | ICD-10-CM | POA: Diagnosis not present

## 2015-04-23 DIAGNOSIS — I723 Aneurysm of iliac artery: Secondary | ICD-10-CM

## 2015-04-23 DIAGNOSIS — I10 Essential (primary) hypertension: Secondary | ICD-10-CM | POA: Diagnosis not present

## 2015-04-23 DIAGNOSIS — I6521 Occlusion and stenosis of right carotid artery: Secondary | ICD-10-CM

## 2015-04-23 DIAGNOSIS — F418 Other specified anxiety disorders: Secondary | ICD-10-CM | POA: Diagnosis not present

## 2015-04-23 DIAGNOSIS — R4589 Other symptoms and signs involving emotional state: Secondary | ICD-10-CM

## 2015-04-23 DIAGNOSIS — I739 Peripheral vascular disease, unspecified: Secondary | ICD-10-CM

## 2015-04-23 DIAGNOSIS — I700 Atherosclerosis of aorta: Secondary | ICD-10-CM

## 2015-04-23 DIAGNOSIS — M545 Low back pain, unspecified: Secondary | ICD-10-CM

## 2015-04-23 DIAGNOSIS — K228 Other specified diseases of esophagus: Secondary | ICD-10-CM

## 2015-04-23 DIAGNOSIS — N261 Atrophy of kidney (terminal): Secondary | ICD-10-CM

## 2015-04-23 DIAGNOSIS — D500 Iron deficiency anemia secondary to blood loss (chronic): Secondary | ICD-10-CM

## 2015-04-23 DIAGNOSIS — D509 Iron deficiency anemia, unspecified: Secondary | ICD-10-CM | POA: Diagnosis not present

## 2015-04-23 DIAGNOSIS — Z720 Tobacco use: Secondary | ICD-10-CM

## 2015-04-23 DIAGNOSIS — E538 Deficiency of other specified B group vitamins: Secondary | ICD-10-CM | POA: Diagnosis not present

## 2015-04-23 DIAGNOSIS — K2289 Other specified disease of esophagus: Secondary | ICD-10-CM

## 2015-04-23 MED ORDER — ESCITALOPRAM OXALATE 10 MG PO TABS: 10.0000 mg | ORAL_TABLET | Freq: Every day | ORAL | Status: DC

## 2015-04-23 MED ORDER — LORAZEPAM 0.5 MG PO TABS: 0.5000 mg | ORAL_TABLET | Freq: Two times a day (BID) | ORAL | Status: DC | PRN

## 2015-04-23 NOTE — Progress Notes (Signed)
BP 173/80 mmHg  Pulse 79  Temp(Src) 98.9 F (37.2 C)  Ht 5' 1.5" (1.562 m)  Wt 149 lb (67.586 kg)  BMI 27.70 kg/m2  SpO2 99%   Subjective:    Patient ID: Julia Cunningham, female    DOB: [birth year], 70 y.o.   MRN: [id number]  HPI: Julia Cunningham is a 70 y.o. female  Chief Complaint  Patient presents with  . Anemia    ED follow up for anemia  . Referral    She had eye exam scheduled for the 10th   She went to the ER and they gave her some scans and then injected something; he couldn't find anything; she had bad pains in her stomach and back; the nurse said there was bacteria and they gave her cipro; abd pain and back pain and left hip pain; it was really the hip pain was what was so bad and make her go to the ER  CLINICAL DATA: Two day history of generalized abdominal pain and bilateral flank pain and low back pain. Surgical history includes cholecystectomy. Personal history of left breast cancer post mastectomy the 1990s.  EXAM: CT ABDOMEN AND PELVIS WITH CONTRAST  TECHNIQUE: Multidetector CT imaging of the abdomen and pelvis was performed using the standard protocol following bolus administration of intravenous contrast.  CONTRAST: 166mL OMNIPAQUE IOHEXOL 300 MG/ML IV. Oral contrast was also administered.  COMPARISON: None.  FINDINGS: Lower chest: Left mastectomy. No evidence of recurrent disease in the lower chest wall. Heart mildly enlarged. Visualized lung bases clear.  Hepatobiliary: Liver normal in size and appearance. Gallbladder surgically absent. No unexpected biliary ductal dilation.  Pancreas: Normal in appearance without evidence of mass, ductal dilation, or inflammation.  Spleen: Normal in size and appearance.  Adrenals/Urinary Tract: Normal appearing adrenal glands. Atrophic right kidney with severe diffuse cortical thinning. Compensatory hypertrophy of the left kidney. Nonobstructing approximate 3 mm calculus in a mid calix of the left  kidney. No urinary tract calculi elsewhere on either side. Approximate 2 cm simple cyst arising from the lower pole of the left kidney. Subcentimeter simple cyst arising from the mid right kidney. No solid mass involving either kidney. Normal-appearing urinary bladder.  Stomach/Bowel: Circumferential wall thickening involving the visualized distal esophagus extending to the esophagogastric junction. Stomach normal in appearance for the degree of distention. Descending and sigmoid colon diverticulosis without evidence of acute diverticulitis. Sigmoid colon tortuous and redundant. Expected stool burden. Normal appendix in the right upper pelvis.  Vascular/Lymphatic: Severe aortoiliofemoral atherosclerosis with mixed calcified and noncalcified plaque. Distal right common iliac artery aneurysm measuring up to 1.6 cm diameter. Possible hemodynamically significant stenosis involving the right common iliac artery due to mural thrombus in the aneurysm. Occluded left common iliac artery with reconstitution of the external iliac artery via internal iliac collaterals. Stenosis at the origin of the right renal artery. Celiac, SMA and IMA patent though atherosclerotic.  No pathologic lymphadenopathy.  Reproductive: Atrophic uterus and ovaries consistent with age. No adnexal masses.  Other: None.  Musculoskeletal: Degenerative disc disease and spondylosis involving multiple levels of the lower thoracic spine, L1-2, and L5-S1.  IMPRESSION: 1. No acute abnormalities involving the abdomen or pelvis. 2. Nonobstructing 3 mm calculus in a mid calix of the left kidney. 3. Circumferential wall thickening involving the visualized distal esophagus extending to the esophagogastric junction. Query GE reflux disease though there is no evidence of hiatal hernia. 4. Descending and sigmoid colon diverticulosis without evidence of acute diverticulitis. 5. Severe generalized atherosclerosis with mixed  calcified and noncalcified plaque. 6. 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 Possible hemodynamically significant stenosis involving the right common iliac artery. 7. Occlusion of the left common iliac artery with reconstitution of the external iliac artery via internal iliac collaterals.   Electronically Signed  By: Evangeline Dakin M.D.  On: 03/20/2015 23:30  She is so stressed; she does not want to go see any more doctors or have any more tests right now She would not even want chemo if she had cancer; discussed the esophagus, could be esophageal cancer  She saw the podiatrist and he said her circulation was a little slow; ulcer is getting better She has a vascular doctor; he saw her a couple of months ago; if she can stand the pain (in the calf), he can do something  She says her diabetes has been great; has testing strips; no dry mouth or blurred vision; has eye doctor appt on March 10th Does check feet every night March 1st 121 in the AM Feb 28th 76 Feb 27th 103 Highest in the last month was 152 Blood pressures at home: 137/80, 133/76, 159/91 on Feb 13th, 139/89, 116/67 on Jan 29th  Sometimes depressed, situational; has used valium in the past; used SSRI after her husband died and she didn't want to get out of bed; she laid in bed for 2 weeks; maybe paxil; no problems  No burning with urination  Relevant past medical, surgical, family and social history reviewed and updated as indicated. Interim medical history since our last visit reviewed. Allergies and medications reviewed and updated.  Review of Systems  Per HPI unless specifically indicated above     Objective:    BP 173/80 mmHg  Pulse 79  Temp(Src) 98.9 F (37.2 C)  Ht 5' 1.5" (1.562 m)  Wt 149 lb (67.586 kg)  BMI 27.70 kg/m2  SpO2 99%  Wt Readings from Last 3 Encounters:  04/23/15 149 lb (67.586 kg)  03/20/15 143 lb (64.864 kg)  03/12/15 146 lb 9.6 oz (66.497 kg)   Physical Exam   Constitutional: She appears well-developed and well-nourished. No distress.  HENT:  Head: Normocephalic and atraumatic.  Eyes: EOM are normal. No scleral icterus.  Neck: Carotid bruit is present (right). No thyromegaly present.  Cardiovascular: Normal rate, regular rhythm and normal heart sounds.   No murmur heard. Trace d pedis pulses; distal aspects of both feet are cool, erythematous and blanching; no violaceous or purplish  Pulmonary/Chest: Effort normal and breath sounds normal. No respiratory distress. She has no wheezes.  Abdominal: Soft. Bowel sounds are normal. She exhibits no distension.  Musculoskeletal: Normal range of motion. She exhibits no edema.  Neurological: She is alert. She exhibits normal muscle tone.  Skin: Skin is warm and dry. She is not diaphoretic. No pallor.  Ulceration on distal toe left foot improved significantly  Psychiatric: Her behavior is normal. Judgment and thought content normal. Her mood appears anxious. She does not exhibit a depressed mood.      Assessment & Plan:   Problem List Items Addressed This Visit      Cardiovascular and Mediastinum   Essential hypertension, benign    Not controlled today; patient is anxious, stressed about health; DASH guidelines, smoking cessation encouraged; will address the stress and anxiety with medication and see if that is enough to bring pressure down      Aortic atherosclerosis (HCC)    Severe, generalized, noted on scan; reveiwed with patient; urged her to seriously try to quit smoking;  explained smoking is directly involved in these blockages and not all blockages can be bypassed or stented; she has vascular surgeon and I hope she'll communicate with him about all of the findings on this recent CT scan      Peripheral vascular disease [facility])    Encouraged patient to contact her vascular doctor about all of the findings noted on her recent CT scan; she just doesn't think she can do any more tests or doctor  visits right now      Iliac artery aneurysm, 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[place])     Digestive   Esophageal thickening    Discussed with patient, noted on CT scan done in ER in January; recommended direct visualization; could be reflux but could be cancer; she just does not want any more tests, any more doctor visits; she is just overwhelmed with health issues right now; says she would not want chemo anyway even if it is cancer        Genitourinary   Acquired atrophy of kidney    Found incidentally on CT scan Jan 2017 in the ER; significant stenosis at origin of right renal artery; reviewed this in detail with patient        Other   Tobacco abuse    With severe atherosclerosis; strongly urged patient to quit smoking; I am here to help any way I can to help her quit smoking; ongoing tobacco use is only going to further progress her plaque formation, aneurysm formation, etc.      Microcytic hypochromic anemia    Recheck today; could be sign of blood loss from GI tract      Relevant Medications   Ferrous Sulfate Dried (SLOW IRON PO)   Anxiety about health    Discussed with patient; supportive listening; appreciate that she feels overwhelmed; will start SSRI and give her limited Rx for benzo for stormy moments only; not planning to continue this long-term; do not drink alcohol with this medicine      Relevant Medications   escitalopram (LEXAPRO) 10 MG tablet   LORazepam (ATIVAN) 0.5 MG tablet    Other Visit Diagnoses    Iron deficiency anemia due to chronic blood loss    -  Primary    Relevant Medications    Ferrous Sulfate Dried (SLOW IRON PO)    Anemia, unspecified anemia type        Relevant Medications    Ferrous Sulfate Dried (SLOW IRON PO)    Bilateral low back pain without sciatica           Follow up plan: Return in about 3 weeks (around 05/14/2015) for medication follow-up.

## 2015-04-23 NOTE — Patient Instructions (Signed)
Please do call your vascular doctor and have him review your CT scan We'll get labs today Start the new escitalopram daily to help lessen anxiety Use the new lorazepam for stormy moments only Never mix with lorazepam with alcohol or pain pills or sleeping pills Return in 3 weeks, but call sooner if needed

## 2015-04-24 LAB — IRON AND TIBC
IRON SATURATION: 35 % (ref 15–55)
IRON: 145 ug/dL — AB (ref 27–139)
TIBC: 410 ug/dL (ref 250–450)
UIBC: 265 ug/dL (ref 118–369)

## 2015-04-24 LAB — FERRITIN: FERRITIN: 18 ng/mL (ref 15–150)

## 2015-04-24 LAB — VITAMIN B12: Vitamin B-12: 390 pg/mL (ref 211–946)

## 2015-04-26 LAB — CBC WITH DIFFERENTIAL/PLATELET
HEMATOCRIT: 36.2 % (ref 34.0–46.6)
Hemoglobin: 11.4 g/dL (ref 11.1–15.9)
LYMPHS ABS: 2.5 10*3/uL (ref 0.7–3.1)
LYMPHS: 24 %
MCH: 25 pg — ABNORMAL LOW (ref 26.6–33.0)
MCHC: 32 g/dL (ref 31.5–35.7)
MCV: 79 fL (ref 79–97)
MID (ABSOLUTE): 1 10*3/uL (ref 0.1–1.6)
MID: 9 %
Neutrophils Absolute: 7.1 10*3/uL — ABNORMAL HIGH (ref 1.4–7.0)
Neutrophils: 67 %
PLATELETS: 472 10*3/uL — AB (ref 150–379)
RBC: 4.6 x10E6/uL (ref 3.77–5.28)
RDW: 18.5 % — AB (ref 12.3–15.4)
WBC: 10.6 10*3/uL (ref 3.4–10.8)

## 2015-04-26 LAB — UA/M W/RFLX CULTURE, ROUTINE
BILIRUBIN UA: NEGATIVE
GLUCOSE, UA: NEGATIVE
KETONES UA: NEGATIVE
Leukocytes, UA: NEGATIVE
NITRITE UA: NEGATIVE
Protein, UA: NEGATIVE
RBC, UA: NEGATIVE
Specific Gravity, UA: 1.01 (ref 1.005–1.030)
UUROB: 0.2 mg/dL (ref 0.2–1.0)
pH, UA: 5.5 (ref 5.0–7.5)

## 2015-04-27 ENCOUNTER — Encounter: Payer: Self-pay | Admitting: Family Medicine

## 2015-04-27 NOTE — Telephone Encounter (Signed)
See message; they did not receive letter; need appts rescheduled; thanks

## 2015-04-28 DIAGNOSIS — N261 Atrophy of kidney (terminal): Secondary | ICD-10-CM | POA: Insufficient documentation

## 2015-04-28 DIAGNOSIS — I723 Aneurysm of iliac artery: Secondary | ICD-10-CM | POA: Insufficient documentation

## 2015-04-28 DIAGNOSIS — K2289 Other specified disease of esophagus: Secondary | ICD-10-CM | POA: Insufficient documentation

## 2015-04-28 DIAGNOSIS — K228 Other specified diseases of esophagus: Secondary | ICD-10-CM | POA: Insufficient documentation

## 2015-04-28 DIAGNOSIS — F418 Other specified anxiety disorders: Secondary | ICD-10-CM | POA: Insufficient documentation

## 2015-04-28 NOTE — Assessment & Plan Note (Addendum)
Not controlled today; patient is anxious, stressed about health; DASH guidelines, smoking cessation encouraged; will address the stress and anxiety with medication and see if that is enough to bring pressure down

## 2015-04-28 NOTE — Assessment & Plan Note (Signed)
Encouraged patient to contact her vascular doctor about all of the findings noted on her recent CT scan; she just doesn't think she can do any more tests or doctor visits right now

## 2015-04-28 NOTE — Assessment & Plan Note (Signed)
Recheck today; could be sign of blood loss from GI tract

## 2015-04-28 NOTE — Assessment & Plan Note (Signed)
Severe, generalized, noted on scan; reveiwed with patient; urged her to seriously try to quit smoking; explained smoking is directly involved in these blockages and not all blockages can be bypassed or stented; she has vascular surgeon and I hope she'll communicate with him about all of the findings on this recent CT scan

## 2015-04-28 NOTE — Assessment & Plan Note (Signed)
Discussed with patient; supportive listening; appreciate that she feels overwhelmed; will start SSRI and give her limited Rx for benzo for stormy moments only; not planning to continue this long-term; do not drink alcohol with this medicine

## 2015-04-28 NOTE — Assessment & Plan Note (Signed)
Discussed with patient, noted on CT scan done in ER in January; recommended direct visualization; could be reflux but could be cancer; she just does not want any more tests, any more doctor visits; she is just overwhelmed with health issues right now; says she would not want chemo anyway even if it is cancer

## 2015-04-28 NOTE — Assessment & Plan Note (Signed)
Found incidentally on CT scan Jan 2017 in the ER; significant stenosis at origin of right renal artery; reviewed this in detail with patient

## 2015-04-28 NOTE — Assessment & Plan Note (Signed)
With severe atherosclerosis; strongly urged patient to quit smoking; I am here to help any way I can to help her quit smoking; ongoing tobacco use is only going to further progress her plaque formation, aneurysm formation, etc.

## 2015-04-30 DIAGNOSIS — E119 Type 2 diabetes mellitus without complications: Secondary | ICD-10-CM | POA: Diagnosis not present

## 2015-04-30 LAB — HM DIABETES EYE EXAM

## 2015-05-06 ENCOUNTER — Encounter: Payer: Self-pay | Admitting: Family Medicine

## 2015-05-06 NOTE — Telephone Encounter (Signed)
Message sent back to patient explaining the difference between medicare wellness and a routine follow up. Advised her to schedule a routine follow up for her issues and also a medicare wellness.

## 2015-05-12 ENCOUNTER — Ambulatory Visit: Payer: Medicare Other | Admitting: Family Medicine

## 2015-05-20 ENCOUNTER — Encounter: Payer: Self-pay | Admitting: Family Medicine

## 2015-05-20 DIAGNOSIS — E538 Deficiency of other specified B group vitamins: Secondary | ICD-10-CM | POA: Insufficient documentation

## 2015-05-21 ENCOUNTER — Other Ambulatory Visit: Payer: Self-pay | Admitting: Family Medicine

## 2015-05-21 NOTE — Telephone Encounter (Signed)
Jan 2017 creatinine reviewed; Rx approved

## 2015-06-07 ENCOUNTER — Encounter: Payer: Self-pay | Admitting: Family Medicine

## 2015-06-07 NOTE — Telephone Encounter (Signed)
Cassandra, can you help patient with this and send her a note back to her after cancelling appt? Thanks

## 2015-06-11 ENCOUNTER — Ambulatory Visit: Payer: Medicare Other | Admitting: Family Medicine

## 2015-06-14 ENCOUNTER — Telehealth: Payer: Self-pay | Admitting: *Deleted

## 2015-06-14 NOTE — Telephone Encounter (Signed)
Notified patient that her follow up  lung cancer screening low dose CT scan is due. Confirmed that patient is within age range of 11-77, (89)  asymptomatic of lung cancer, and no other serious disease processes that would make treatment of lung cancer not possible. The patient is a current smoker  with a 41 pack/ year history. The shared decision making visit was completed 05/08/14. The patient is agreeable for 1 year follow up CT scan to be scheduled.

## 2015-06-18 ENCOUNTER — Encounter: Payer: Self-pay | Admitting: Family Medicine

## 2015-06-18 ENCOUNTER — Other Ambulatory Visit: Payer: Self-pay | Admitting: Family Medicine

## 2015-06-18 DIAGNOSIS — Z87891 Personal history of nicotine dependence: Secondary | ICD-10-CM

## 2015-06-18 HISTORY — DX: Personal history of nicotine dependence: Z87.891

## 2015-06-25 ENCOUNTER — Ambulatory Visit: Admission: RE | Admit: 2015-06-25 | Payer: Medicare Other | Source: Ambulatory Visit

## 2015-06-28 ENCOUNTER — Ambulatory Visit: Payer: Medicare Other | Admitting: Family Medicine

## 2015-06-29 ENCOUNTER — Other Ambulatory Visit: Payer: Self-pay | Admitting: Family Medicine

## 2015-06-30 NOTE — Telephone Encounter (Signed)
Sgpt and lipids from Jan 2017 reviewed; Rx approved

## 2015-07-06 ENCOUNTER — Encounter: Payer: Self-pay | Admitting: Family Medicine

## 2015-07-06 ENCOUNTER — Ambulatory Visit (INDEPENDENT_AMBULATORY_CARE_PROVIDER_SITE_OTHER): Payer: Medicare Other | Admitting: Family Medicine

## 2015-07-06 VITALS — BP 120/86 | HR 66 | Temp 98.0°F | Resp 14 | Wt 149.0 lb

## 2015-07-06 DIAGNOSIS — I6521 Occlusion and stenosis of right carotid artery: Secondary | ICD-10-CM

## 2015-07-06 DIAGNOSIS — E1151 Type 2 diabetes mellitus with diabetic peripheral angiopathy without gangrene: Secondary | ICD-10-CM

## 2015-07-06 DIAGNOSIS — I700 Atherosclerosis of aorta: Secondary | ICD-10-CM

## 2015-07-06 DIAGNOSIS — D509 Iron deficiency anemia, unspecified: Secondary | ICD-10-CM

## 2015-07-06 DIAGNOSIS — Z720 Tobacco use: Secondary | ICD-10-CM | POA: Diagnosis not present

## 2015-07-06 DIAGNOSIS — Z853 Personal history of malignant neoplasm of breast: Secondary | ICD-10-CM | POA: Diagnosis not present

## 2015-07-06 NOTE — Assessment & Plan Note (Signed)
Encouraged pt to quit, I am here if/when ready to quit

## 2015-07-06 NOTE — Progress Notes (Signed)
BP 120/86 mmHg  Pulse 66  Temp(Src) 98 F (36.7 C) (Oral)  Resp 14  Wt 149 lb (67.586 kg)  SpO2 99%   Subjective:    Patient ID: Julia Cunningham, female    DOB: 02/14/1946, 70 y.o.   MRN: [id number]  HPI: Julia Cunningham is a 70 y.o. female  Chief Complaint  Patient presents with  . Follow-up   Patient is known to me from my previous practice Here for follow-up PVD; sees Dr. Lucky Cowboy; no plans for surgery now until pain in her legs gets to be too much; does have claudication, has to walk for a while before it happens  Type 2 diabetes Has the meter, FSBS fluctuates depending on what she eats; pasta and potatoes, cooking better and limiting pastas Had a cup of coffee this morning, not fasting Last A1c was 6.6 3 months ago (controlled) Next A1c due July 21st No sugary drinks Really watching feet; using lactin lotion with good results  BP is controlled today; checks away from here, 120s over 70-80  Overdue for mammogram  Depression screen PHQ 2/9 07/06/2015  Decreased Interest 0  Down, Depressed, Hopeless 0  PHQ - 2 Score 0   Relevant past medical, surgical, family and social history reviewed Past Medical History  Diagnosis Date  . Hypertension   . Hypertriglyceridemia   . Atherosclerosis of right carotid artery   . Tobacco abuse   . Diabetes mellitus without complication ([place])   . GI bleed not requiring more than 4 units of blood in 24 hours, ICU, or surgery Aug 15-17, 2015    2 units PRBCs  . Aortic atherosclerosis ([place])   . Vertebral artery occlusion   . Breast cancer ([place])     Breast left   . Collagen vascular disease ([place])   . Personal history of tobacco use, presenting hazards to health 06/18/2015  . Controlled type 2 diabetes mellitus with circulatory disorder, without long-term current use of insulin [facility])    Past Surgical History  Procedure Laterality Date  . Cholecystectomy  02/20/1978  . Breast surgery Left 02/21/1988    Mastectomy  . Carotid endarterectomy  Left July 2015   Family History  Problem Relation Age of Onset  . Heart disease Mother     in her 76's  . Stroke Mother     73 yo  . Hypertension Mother   . Heart attack Mother   . Heart disease Father     26 yo  . Diabetes Father   . Asthma Father   . Heart attack Father   . Hypertension Father   . Cancer Neg Hx   . COPD Neg Hx    Social History  Substance Use Topics  . Smoking status: Current Every Day Smoker -- 1.00 packs/day for 50 years    Types: Cigarettes  . Smokeless tobacco: Never Used  . Alcohol Use: No   Interim medical history since last visit reviewed. Allergies and medications reviewed  Review of Systems Per HPI unless specifically indicated above     Objective:    BP 120/86 mmHg  Pulse 66  Temp(Src) 98 F (36.7 C) (Oral)  Resp 14  Wt 149 lb (67.586 kg)  SpO2 99%  Wt Readings from Last 3 Encounters:  07/06/15 149 lb (67.586 kg)  04/23/15 149 lb (67.586 kg)  03/20/15 143 lb (64.864 kg)    Physical Exam     Assessment & Plan:   Problem List Items Addressed This Visit  Cardiovascular and Mediastinum   Aortic atherosclerosis ([place])    Managed by vascular doctor; patient unfortunately continues to smoke; smoking cessation is the single biggest and best thing she can do for her health right now; otherwise, her vascular health will continue to deteriorate; manage pressures, lipids, sugars      Controlled type 2 diabetes mellitus with circulatory disorder, without long-term current use of insulin (HCC) - Primary    Last A1c showed control, A1c 6.6; next A1c due in late July; patient advised to watch her feet closely; smoking cessation would obviously help        Other   Hx of breast cancer   Relevant Orders   MM DIGITAL SCREENING BILATERAL   Microcytic hypochromic anemia    Check CBC; start B12, continue iron every other day      Relevant Orders   CBC with Differential/Platelet (Completed)   Iron and TIBC (Completed)   Reticulocytes  (Completed)   Tobacco abuse    Encouraged pt to quit, I am here if/when ready to quit         Follow up plan: Return in about 4 weeks (around 08/03/2015) for Medicare Wellness visit, 40 minute visit; July 21st or later fasting visit.  An after-visit summary was printed and given to the patient at [facility].  Please see the patient instructions which may contain other information and recommendations beyond what is mentioned above in the assessment and plan.  Orders Placed This Encounter  Procedures  . MM DIGITAL SCREENING BILATERAL  . CBC with Differential/Platelet  . Iron and TIBC  . Reticulocytes

## 2015-07-06 NOTE — Patient Instructions (Addendum)
Consider buckwheat noodles which have a lower glycemic index than regular white pasta I do encourage you to quit smoking Call [phone] to sign up for smoking cessation classes You can call 1-800-QUIT-NOW to talk with a smoking cessation coach Please do see your eye doctor regularly, and have your eyes examined every year (or more often per his or her recommendation) Check your feet every night and let me know right away of any sores, infections, numbness, etc. Try to limit sweets, white bread, white rice, white potatoes It is okay with me for you to not check your fingerstick blood sugars (per [employer] of Endocrinology Best Practices), unless you are interested and feel it would be helpful for you Please have labs done today Return on or after July 21st for next fasting labs and visit for diabetes and cholesterol

## 2015-07-06 NOTE — Assessment & Plan Note (Addendum)
Check CBC; start B12, continue iron every other day

## 2015-07-07 ENCOUNTER — Other Ambulatory Visit: Payer: Self-pay | Admitting: Family Medicine

## 2015-07-07 DIAGNOSIS — K2289 Other specified disease of esophagus: Secondary | ICD-10-CM

## 2015-07-07 DIAGNOSIS — K228 Other specified diseases of esophagus: Secondary | ICD-10-CM

## 2015-07-07 DIAGNOSIS — D509 Iron deficiency anemia, unspecified: Secondary | ICD-10-CM

## 2015-07-07 LAB — CBC WITH DIFFERENTIAL/PLATELET
Basophils Absolute: 0.1 10*3/uL (ref 0.0–0.2)
Basos: 1 %
EOS (ABSOLUTE): 0.2 10*3/uL (ref 0.0–0.4)
EOS: 1 %
HEMATOCRIT: 37.7 % (ref 34.0–46.6)
Hemoglobin: 11.7 g/dL (ref 11.1–15.9)
IMMATURE GRANULOCYTES: 0 %
Immature Grans (Abs): 0 10*3/uL (ref 0.0–0.1)
LYMPHS ABS: 2.7 10*3/uL (ref 0.7–3.1)
Lymphs: 22 %
MCH: 23.7 pg — ABNORMAL LOW (ref 26.6–33.0)
MCHC: 31 g/dL — AB (ref 31.5–35.7)
MCV: 77 fL — ABNORMAL LOW (ref 79–97)
MONOS ABS: 0.7 10*3/uL (ref 0.1–0.9)
Monocytes: 6 %
NEUTROS PCT: 70 %
Neutrophils Absolute: 8.5 10*3/uL — ABNORMAL HIGH (ref 1.4–7.0)
PLATELETS: 418 10*3/uL — AB (ref 150–379)
RBC: 4.93 x10E6/uL (ref 3.77–5.28)
RDW: 18.1 % — AB (ref 12.3–15.4)
WBC: 12.2 10*3/uL — AB (ref 3.4–10.8)

## 2015-07-07 LAB — IRON AND TIBC
Iron Saturation: 9 % — CL (ref 15–55)
Iron: 37 ug/dL (ref 27–139)
Total Iron Binding Capacity: 409 ug/dL (ref 250–450)
UIBC: 372 ug/dL — AB (ref 118–369)

## 2015-07-07 LAB — RETICULOCYTES: Retic Ct Pct: 1.1 % (ref 0.6–2.6)

## 2015-07-07 NOTE — Assessment & Plan Note (Signed)
Refer to GI 

## 2015-08-03 ENCOUNTER — Encounter: Payer: Self-pay | Admitting: Family Medicine

## 2015-08-03 DIAGNOSIS — Z853 Personal history of malignant neoplasm of breast: Secondary | ICD-10-CM | POA: Insufficient documentation

## 2015-08-03 NOTE — Assessment & Plan Note (Signed)
Managed by vascular doctor; patient unfortunately continues to smoke; smoking cessation is the single biggest and best thing she can do for her health right now; otherwise, her vascular health will continue to deteriorate; manage pressures, lipids, sugars

## 2015-08-03 NOTE — Assessment & Plan Note (Signed)
Last A1c showed control, A1c 6.6; next A1c due in late July; patient advised to watch her feet closely; smoking cessation would obviously help

## 2015-08-09 ENCOUNTER — Encounter: Payer: Self-pay | Admitting: Family Medicine

## 2015-08-09 ENCOUNTER — Ambulatory Visit (INDEPENDENT_AMBULATORY_CARE_PROVIDER_SITE_OTHER): Payer: Medicare Other | Admitting: Family Medicine

## 2015-08-09 VITALS — BP 162/80 | HR 62 | Temp 98.8°F | Resp 16 | Wt 152.0 lb

## 2015-08-09 DIAGNOSIS — Z0000 Encounter for general adult medical examination without abnormal findings: Secondary | ICD-10-CM | POA: Diagnosis not present

## 2015-08-09 DIAGNOSIS — I10 Essential (primary) hypertension: Secondary | ICD-10-CM

## 2015-08-09 MED ORDER — METOPROLOL TARTRATE 50 MG PO TABS: 50.0000 mg | ORAL_TABLET | Freq: Two times a day (BID) | ORAL | Status: DC

## 2015-08-09 MED ORDER — AMLODIPINE BESYLATE 5 MG PO TABS: 5.0000 mg | ORAL_TABLET | Freq: Every day | ORAL | Status: DC

## 2015-08-09 MED ORDER — GLIMEPIRIDE 2 MG PO TABS: 2.0000 mg | ORAL_TABLET | Freq: Every day | ORAL | Status: DC

## 2015-08-09 NOTE — Assessment & Plan Note (Signed)
Add amlodipine, continue the other meds; try the DASH guidelines

## 2015-08-09 NOTE — Patient Instructions (Addendum)
Your goal blood pressure is less than 150 mmHg on top, ideally under 140. Try to follow the DASH guidelines (DASH stands for Dietary Approaches to Stop Hypertension) Try to limit the sodium in your diet.  Ideally, consume less than 1.5 grams (less than 1,500mg ) per day. Do not add salt when cooking or at the table.  Check the sodium amount on labels when shopping, and choose items lower in sodium when given a choice. Avoid or limit foods that already contain a lot of sodium. Eat a diet rich in fruits and vegetables and whole grains.  Check out the information at familydoctor.org entitled "Nutrition for Weight Loss: What You Need to Know about Fad Diets" Try to lose between 1-2 pounds per week by taking in fewer calories and burning off more calories You can succeed by limiting portions, limiting foods dense in calories and fat, becoming more active, and drinking 8 glasses of water a day (64 ounces) Don't skip meals, especially breakfast, as skipping meals may alter your metabolism Do not use over-the-counter weight loss pills or gimmicks that claim rapid weight loss A healthy BMI (or body mass index) is between 18.5 and 24.9 You can calculate your ideal BMI at the [place] website ClubMonetize.fr  Please do consider getting yearly mammograms (I strongly recommend those)  Stop the metformin and monitor sugars a few days a week and let me know before next appt if above 200  I do encourage you to quit smoking Call [phone] to sign up for smoking cessation classes You can call 1-800-QUIT-NOW to talk with a smoking cessation coach  Take 1,000 iu of vitamin D3 once a day  Please do call Dr. Bunnie Domino office and schedule an appt with him to go over your scan from January 2017    [place] Eating Plan DASH stands for "Dietary Approaches to Stop Hypertension." The DASH eating plan is a healthy eating plan that has been shown to reduce high blood pressure  (hypertension). Additional health benefits may include reducing the risk of type 2 diabetes mellitus, heart disease, and stroke. The DASH eating plan may also help with weight loss. WHAT DO I NEED TO KNOW ABOUT THE DASH EATING PLAN? For the DASH eating plan, you will follow these general guidelines:  Choose foods with a percent daily value for sodium of less than 5% (as listed on the food label).  Use salt-free seasonings or herbs instead of table salt or sea salt.  Check with your health care provider or pharmacist before using salt substitutes.  Eat lower-sodium products, often labeled as "lower sodium" or "no salt added."  Eat fresh foods.  Eat more vegetables, fruits, and low-fat dairy products.  Choose whole grains. Look for the word "whole" as the first word in the ingredient list.  Choose fish and skinless chicken or [place] more often than red meat. Limit fish, poultry, and meat to 6 oz (170 g) each day.  Limit sweets, desserts, sugars, and sugary drinks.  Choose heart-healthy fats.  Limit cheese to 1 oz (28 g) per day.  Eat more home-cooked food and less restaurant, buffet, and fast food.  Limit fried foods.  Cook foods using methods other than frying.  Limit canned vegetables. If you do use them, rinse them well to decrease the sodium.  When eating at a restaurant, ask that your food be prepared with less salt, or no salt if possible. WHAT FOODS CAN I EAT? Seek help from a dietitian for individual calorie needs. Grains Whole grain  or whole wheat bread. Brown rice. Whole grain or whole wheat pasta. Quinoa, bulgur, and whole grain cereals. Low-sodium cereals. Corn or whole wheat flour tortillas. Whole grain cornbread. Whole grain crackers. Low-sodium crackers. Vegetables Fresh or frozen vegetables (raw, steamed, roasted, or grilled). Low-sodium or reduced-sodium tomato and vegetable juices. Low-sodium or reduced-sodium tomato sauce and paste. Low-sodium or  reduced-sodium canned vegetables.  Fruits All fresh, canned (in natural juice), or frozen fruits. Meat and Other Protein Products Ground beef (85% or leaner), grass-fed beef, or beef trimmed of fat. Skinless chicken or [place]. Ground chicken or [place]. Pork trimmed of fat. All fish and seafood. Eggs. Dried beans, peas, or lentils. Unsalted nuts and seeds. Unsalted canned beans. Dairy Low-fat dairy products, such as skim or 1% milk, 2% or reduced-fat cheeses, low-fat ricotta or cottage cheese, or plain low-fat yogurt. Low-sodium or reduced-sodium cheeses. Fats and Oils Tub margarines without trans fats. Light or reduced-fat mayonnaise and salad dressings (reduced sodium). Avocado. Safflower, olive, or canola oils. Natural peanut or almond butter. Other Unsalted popcorn and pretzels. The items listed above may not be a complete list of recommended foods or beverages. Contact your dietitian for more options. WHAT FOODS ARE NOT RECOMMENDED? Grains White bread. White pasta. White rice. Refined cornbread. Bagels and croissants. Crackers that contain trans fat. Vegetables Creamed or fried vegetables. Vegetables in a cheese sauce. Regular canned vegetables. Regular canned tomato sauce and paste. Regular tomato and vegetable juices. Fruits Dried fruits. Canned fruit in light or heavy syrup. Fruit juice. Meat and Other Protein Products Fatty cuts of meat. Ribs, chicken wings, bacon, sausage, bologna, salami, chitterlings, fatback, hot dogs, bratwurst, and packaged luncheon meats. Salted nuts and seeds. Canned beans with salt. Dairy Whole or 2% milk, cream, half-and-half, and cream cheese. Whole-fat or sweetened yogurt. Full-fat cheeses or blue cheese. Nondairy creamers and whipped toppings. Processed cheese, cheese spreads, or cheese curds. Condiments Onion and garlic salt, seasoned salt, table salt, and sea salt. Canned and packaged gravies. Worcestershire sauce. Tartar sauce. Barbecue sauce. Teriyaki  sauce. Soy sauce, including reduced sodium. Steak sauce. Fish sauce. Oyster sauce. Cocktail sauce. Horseradish. Ketchup and mustard. Meat flavorings and tenderizers. Bouillon cubes. Hot sauce. Tabasco sauce. Marinades. Taco seasonings. Relishes. Fats and Oils Butter, stick margarine, lard, shortening, ghee, and bacon fat. Coconut, palm kernel, or palm oils. Regular salad dressings. Other Pickles and olives. Salted popcorn and pretzels. The items listed above may not be a complete list of foods and beverages to avoid. Contact your dietitian for more information. WHERE CAN I FIND MORE INFORMATION? National Heart, Lung, and Blood Institute: travelstabloid.com   This information is not intended to replace advice given to you by your health care provider. Make sure you discuss any questions you have with your health care provider.   Document Released: 01/26/2011 Document Revised: 02/27/2014 Document Reviewed: 12/11/2012 Elsevier Interactive Patient Education 2016 [employer]. Smoking Cessation, Tips for Success If you are ready to quit smoking, congratulations! You have chosen to help yourself be healthier. Cigarettes bring nicotine, tar, carbon monoxide, and other irritants into your body. Your lungs, heart, and blood vessels will be able to work better without these poisons. There are many different ways to quit smoking. Nicotine gum, nicotine patches, a nicotine inhaler, or nicotine nasal spray can help with physical craving. Hypnosis, support groups, and medicines help break the habit of smoking. WHAT THINGS CAN I DO TO MAKE QUITTING EASIER?  Here are some tips to help you quit for good:  Pick a date  when you will quit smoking completely. Tell all of your friends and family about your plan to quit on that date.  Do not try to slowly cut down on the number of cigarettes you are smoking. Pick a quit date and quit smoking completely starting on that day.  Throw  away all cigarettes.   Clean and remove all ashtrays from your home, work, and car.  On a card, write down your reasons for quitting. Carry the card with you and read it when you get the urge to smoke.  Cleanse your body of nicotine. Drink enough water and fluids to keep your urine clear or pale yellow. Do this after quitting to flush the nicotine from your body.  Learn to predict your moods. Do not let a bad situation be your excuse to have a cigarette. Some situations in your life might tempt you into wanting a cigarette.  Never have "just one" cigarette. It leads to wanting another and another. Remind yourself of your decision to quit.  Change habits associated with smoking. If you smoked while driving or when feeling stressed, try other activities to replace smoking. Stand up when drinking your coffee. Brush your teeth after eating. Sit in a different chair when you read the paper. Avoid alcohol while trying to quit, and try to drink fewer caffeinated beverages. Alcohol and caffeine may urge you to smoke.  Avoid foods and drinks that can trigger a desire to smoke, such as sugary or spicy foods and alcohol.  Ask people who smoke not to smoke around you.  Have something planned to do right after eating or having a cup of coffee. For example, plan to take a walk or exercise.  Try a relaxation exercise to calm you down and decrease your stress. Remember, you may be tense and nervous for the first 2 weeks after you quit, but this will pass.  Find new activities to keep your hands busy. Play with a pen, coin, or rubber band. Doodle or draw things on paper.  Brush your teeth right after eating. This will help cut down on the craving for the taste of tobacco after meals. You can also try mouthwash.   Use oral substitutes in place of cigarettes. Try using lemon drops, carrots, cinnamon sticks, or chewing gum. Keep them handy so they are available when you have the urge to smoke.  When you  have the urge to smoke, try deep breathing.  Designate your home as a nonsmoking area.  If you are a heavy smoker, ask your health care provider about a prescription for nicotine chewing gum. It can ease your withdrawal from nicotine.  Reward yourself. Set aside the cigarette money you save and buy yourself something nice.  Look for support from others. Join a support group or smoking cessation program. Ask someone at home or at work to help you with your plan to quit smoking.  Always ask yourself, "Do I need this cigarette or is this just a reflex?" Tell yourself, "Today, I choose not to smoke," or "I do not want to smoke." You are reminding yourself of your decision to quit.  Do not replace cigarette smoking with electronic cigarettes (commonly called e-cigarettes). The safety of e-cigarettes is unknown, and some may contain harmful chemicals.  If you relapse, do not give up! Plan ahead and think about what you will do the next time you get the urge to smoke. HOW WILL I FEEL WHEN I QUIT SMOKING? You may have symptoms of withdrawal  because your body is used to nicotine (the addictive substance in cigarettes). You may crave cigarettes, be irritable, feel very hungry, cough often, get headaches, or have difficulty concentrating. The withdrawal symptoms are only temporary. They are strongest when you first quit but will go away within 10-14 days. When withdrawal symptoms occur, stay in control. Think about your reasons for quitting. Remind yourself that these are signs that your body is healing and getting used to being without cigarettes. Remember that withdrawal symptoms are easier to treat than the major diseases that smoking can cause.  Even after the withdrawal is over, expect periodic urges to smoke. However, these cravings are generally short lived and will go away whether you smoke or not. Do not smoke! WHAT RESOURCES ARE AVAILABLE TO HELP ME QUIT SMOKING? Your health care provider can  direct you to community resources or hospitals for support, which may include:  Group support.  Education.  Hypnosis.  Therapy.   This information is not intended to replace advice given to you by your health care provider. Make sure you discuss any questions you have with your health care provider.   Document Released: 11/05/2003 Document Revised: 02/27/2014 Document Reviewed: 07/25/2012 Elsevier Interactive Patient Education [employer].

## 2015-08-09 NOTE — Progress Notes (Signed)
Patient: Julia Cunningham, Female    DOB: 04/16/1945, 70 y.o.   MRN: [id number]  Visit Date: 08/09/2015  Today's Provider: Enid Derry, MD   Chief Complaint  Patient presents with  . Annual Exam    medicare wellness    Subjective:   Julia Cunningham is a 70 y.o. female who presents today for her Subsequent Annual Wellness Visit.  Caregiver input:  N/a  USPSTF grade A and B recommendations Alcohol: none Depression:  Depression screen [facility] 2/9 08/09/2015 07/06/2015  Decreased Interest 0 0  Down, Depressed, Hopeless 0 0  PHQ - 2 Score 0 0   Hypertension: 0000000 systolic, running high at home; [id number] systolic at home; had a headache; no low pressures; not associated with any salt or stress Obesity: overweight category; would like to lose a few pounds Tobacco use: still smoking, 1 ppd; she would like to quit HIV, hep B, hep C:  Test in July with other labs STD testing and prevention (chl/gon/syphilis): politely declined Lipids: July Glucose: July Colorectal cancer: patient declined, 3rd leading cause of cancer death in women Breast cancer: patient declined Intimate partner violence: no Cervical cancer screening: no hx of abnormal paps Lung cancer: patient due, she will call and reschedule Osteoporosis: patient declined Fall prevention/vitamin D: discussed; not taking supplement today AAA: reviewed Jan 2017; to Dr. Lucky Cowboy Aspirin: just plavix Diet: on and off, tries to eat better Exercise: not walking regularly; does her own calisthenics twice a day Skin cancer: no worrisome moles  She stopped the metformin for a week; stopped them and the diarrhea resolved after stopping the metformin  HPI  Review of Systems  Past Medical History  Diagnosis Date  . Hypertension   . Hypertriglyceridemia   . Atherosclerosis of right carotid artery   . Tobacco abuse   . Diabetes mellitus without complication ([place])   . GI bleed not requiring more than 4 units of blood in 24 hours, ICU, or surgery Aug  15-17, 2015    2 units PRBCs  . Aortic atherosclerosis ([place])   . Vertebral artery occlusion   . Breast cancer ([place])     Breast left   . Collagen vascular disease ([place])   . Personal history of tobacco use, presenting hazards to health 06/18/2015  . Controlled type 2 diabetes mellitus with circulatory disorder, without long-term current use of insulin [facility])    Past Surgical History  Procedure Laterality Date  . Cholecystectomy  02/20/1978  . Breast surgery Left 02/21/1988    Mastectomy  . Carotid endarterectomy Left July 2015   Family History  Problem Relation Age of Onset  . Heart disease Mother     in her 19's  . Stroke Mother     68 yo  . Hypertension Mother   . Heart attack Mother   . Heart disease Father     52 yo  . Diabetes Father   . Asthma Father   . Heart attack Father   . Hypertension Father   . Cancer Neg Hx   . COPD Neg Hx     Social History   Social History  . Marital Status: Married    Spouse Name: N/A  . Number of Children: N/A  . Years of Education: N/A   Occupational History  . Not on file.   Social History Main Topics  . Smoking status: Current Every Day Smoker -- 1.00 packs/day for 50 years    Types: Cigarettes  . Smokeless tobacco: Never Used  . Alcohol Use: No  .  Drug Use: No  . Sexual Activity: Yes   Other Topics Concern  . Not on file   Social History Narrative    Outpatient Encounter Prescriptions as of 08/09/2015  Medication Sig Note  . pantoprazole (PROTONIX) 40 MG tablet Take 40 mg by mouth daily. 08/09/2015: Received from: [place]:   . atorvastatin (LIPITOR) 40 MG tablet TAKE 1 TABLET BY MOUTH AT BEDTIME   . clopidogrel (PLAVIX) 75 MG tablet Take 75 mg by mouth daily.   Marland Kitchen glimepiride (AMARYL) 2 MG tablet Take 0.5 tablets (1 mg total) by mouth daily before breakfast. (Patient taking differently: Take 2 mg by mouth daily before breakfast. )   . glucose blood (TRUETEST TEST) test strip Check  fingerstick sugars once a day on average; Dx E11.9; LON 99 months   . Lancets 30G MISC Reli-On brand requested by patient; check fingerstick sugars once a day on average; Dx E11.9   . LORazepam (ATIVAN) 0.5 MG tablet Take 1 tablet (0.5 mg total) by mouth 2 (two) times daily as needed for anxiety. No alcohol or sleeping pills or pain pills   . losartan (COZAAR) 100 MG tablet Take 1 tablet (100 mg total) by mouth daily.   . metFORMIN (GLUCOPHAGE-XR) 500 MG 24 hr tablet TAKE 4 TABLETS EVERY EVENING   . metoprolol (LOPRESSOR) 50 MG tablet Take 0.5 tablets (25 mg total) by mouth 2 (two) times daily. (Patient taking differently: Take 50 mg by mouth 2 (two) times daily. )   . pantoprazole (PROTONIX) 40 MG tablet Take 40 mg by mouth daily.   Marland Kitchen triamcinolone cream (KENALOG) 0.1 % Apply 1 application topically daily as needed (external ears).    No facility-administered encounter medications on file as of 08/09/2015.    Functional Ability / Safety Screening 1.  Was the timed Get Up and Go test longer than 30 seconds?  no 2.  Does the patient need help with the phone, transportation, shopping,      preparing meals, housework, laundry, medications, or managing money?  no 3.  Does the patient's home have:  loose throw rugs in the hallway?   no      Grab bars in the bathroom? no      Handrails on the stairs?   no stairs      Poor lighting?   no 4.  Has the patient noticed any hearing difficulties?   no  Fall Risk Assessment See under rooming  Depression Screen See under rooming Depression screen [facility] 2/9 08/09/2015 07/06/2015  Decreased Interest 0 0  Down, Depressed, Hopeless 0 0  PHQ - 2 Score 0 0   Advanced Directives Does patient have a HCPOA?    yes If yes, name and contact information: husband at home Does patient have a living will or MOST form?  no  Objective:   Vitals: BP 162/80 mmHg  Pulse 62  Temp(Src) 98.8 F (37.1 C) (Oral)  Resp 16  Wt 152 lb (68.947 kg)  SpO2 97% Body mass  index is 28.26 kg/(m^2). No exam data present  Physical Exam Mood/affect:   Appearance:    Cognitive Testing - 6-CIT  Correct? Score   What year is it? yes 0 Yes = 0    No = 4  What month is it? yes 0 Yes = 0    No = 3  Remember:     Pia Mau, [place], [place]     What time is it? yes 0  Yes = 0    No = 3  Count backwards from 20 to 1 yes 0 Correct = 0    1 error = 2   More than 1 error = 4  Say the months of the year in reverse. yes 0 Correct = 0    1 error = 2   More than 1 error = 4  What address did I ask you to remember? yes 0 Correct = 0  1 error = 2    2 error = 4    3 error = 6    4 error = 8    All wrong = 10       TOTAL SCORE  0/28   Interpretation:  Normal  Normal (0-7) Abnormal (8-28)    Assessment & Plan:     Annual Wellness Visit  Reviewed patient's Family Medical History Reviewed and updated list of patient's medical providers Assessment of cognitive impairment was done Assessed patient's functional ability Established a written schedule for health screening services Health Risk Assessent Completed and Reviewed  Exercise Activities and Dietary recommendations Goals    None    try to lose 3-5 pounds  There is no immunization history for the selected administration types on file for this patient. She declines pneumonia vaccines; declines flu shots  Health Maintenance  Topic Date Due  . INFLUENZA VACCINE  12/07/2015 (Originally 09/21/2015)  . MAMMOGRAM  12/07/2015 (Originally 05/13/1997)  . DEXA SCAN  12/07/2015 (Originally 05/13/2012)  . COLONOSCOPY  12/07/2015 (Originally 05/13/1997)  . ZOSTAVAX  12/07/2015 (Originally 05/14/2007)  . TETANUS/TDAP  12/07/2015 (Originally 05/14/1966)  . PNA vac Low Risk Adult (1 of 2 - PCV13) 12/07/2015 (Originally 05/13/2012)  . Hepatitis C Screening  03/11/2016 (Originally [birth year])  . HEMOGLOBIN A1C  09/09/2015  . FOOT EXAM  03/11/2016  . OPHTHALMOLOGY EXAM  04/29/2016    Discussed health benefits of physical  activity, and encouraged her to engage in regular exercise appropriate for her age and condition.   Meds ordered this encounter  Medications  . pantoprazole (PROTONIX) 40 MG tablet    Sig: Take 40 mg by mouth daily.    Current outpatient prescriptions:  .  pantoprazole (PROTONIX) 40 MG tablet, Take 40 mg by mouth daily., Disp: , Rfl:  .  atorvastatin (LIPITOR) 40 MG tablet, TAKE 1 TABLET BY MOUTH AT BEDTIME, Disp: 90 tablet, Rfl: 1 .  clopidogrel (PLAVIX) 75 MG tablet, Take 75 mg by mouth daily., Disp: , Rfl:  .  glimepiride (AMARYL) 2 MG tablet, Take 0.5 tablets (1 mg total) by mouth daily before breakfast. (Patient taking differently: Take 2 mg by mouth daily before breakfast. ), Disp: 45 tablet, Rfl: 1 .  glucose blood (TRUETEST TEST) test strip, Check fingerstick sugars once a day on average; Dx E11.9; LON 99 months, Disp: 100 each, Rfl: 1 .  Lancets 30G MISC, Reli-On brand requested by patient; check fingerstick sugars once a day on average; Dx E11.9, Disp: 100 each, Rfl: 1 .  LORazepam (ATIVAN) 0.5 MG tablet, Take 1 tablet (0.5 mg total) by mouth 2 (two) times daily as needed for anxiety. No alcohol or sleeping pills or pain pills, Disp: 12 tablet, Rfl: 0 .  losartan (COZAAR) 100 MG tablet, Take 1 tablet (100 mg total) by mouth daily., Disp: 90 tablet, Rfl: 1 .  metFORMIN (GLUCOPHAGE-XR) 500 MG 24 hr tablet, TAKE 4 TABLETS EVERY EVENING, Disp: 360 tablet, Rfl: 2 .  metoprolol (LOPRESSOR) 50 MG tablet, Take 0.5 tablets (25  mg total) by mouth 2 (two) times daily. (Patient taking differently: Take 50 mg by mouth 2 (two) times daily. ), Disp: 90 tablet, Rfl: 1 .  pantoprazole (PROTONIX) 40 MG tablet, Take 40 mg by mouth daily., Disp: , Rfl:  .  triamcinolone cream (KENALOG) 0.1 %, Apply 1 application topically daily as needed (external ears)., Disp: , Rfl:  There are no discontinued medications.  Next Medicare Wellness Visit in 12+ months Problem List Items Addressed This Visit       Cardiovascular and Mediastinum   Essential hypertension, benign - Primary    Add amlodipine, continue the other meds; try the DASH guidelines      Relevant Medications   metoprolol (LOPRESSOR) 50 MG tablet   amLODipine (NORVASC) 5 MG tablet

## 2015-08-12 DIAGNOSIS — I739 Peripheral vascular disease, unspecified: Secondary | ICD-10-CM | POA: Diagnosis not present

## 2015-08-12 DIAGNOSIS — D509 Iron deficiency anemia, unspecified: Secondary | ICD-10-CM | POA: Diagnosis not present

## 2015-08-12 DIAGNOSIS — K625 Hemorrhage of anus and rectum: Secondary | ICD-10-CM | POA: Diagnosis not present

## 2015-08-12 DIAGNOSIS — I6529 Occlusion and stenosis of unspecified carotid artery: Secondary | ICD-10-CM | POA: Diagnosis not present

## 2015-08-23 ENCOUNTER — Encounter: Payer: Self-pay | Admitting: Family Medicine

## 2015-08-31 DIAGNOSIS — I6529 Occlusion and stenosis of unspecified carotid artery: Secondary | ICD-10-CM | POA: Diagnosis not present

## 2015-08-31 DIAGNOSIS — F17200 Nicotine dependence, unspecified, uncomplicated: Secondary | ICD-10-CM | POA: Diagnosis not present

## 2015-08-31 DIAGNOSIS — M79609 Pain in unspecified limb: Secondary | ICD-10-CM | POA: Diagnosis not present

## 2015-08-31 DIAGNOSIS — I70213 Atherosclerosis of native arteries of extremities with intermittent claudication, bilateral legs: Secondary | ICD-10-CM | POA: Diagnosis not present

## 2015-08-31 DIAGNOSIS — I6523 Occlusion and stenosis of bilateral carotid arteries: Secondary | ICD-10-CM | POA: Diagnosis not present

## 2015-08-31 DIAGNOSIS — E785 Hyperlipidemia, unspecified: Secondary | ICD-10-CM | POA: Diagnosis not present

## 2015-08-31 DIAGNOSIS — I10 Essential (primary) hypertension: Secondary | ICD-10-CM | POA: Diagnosis not present

## 2015-08-31 DIAGNOSIS — I739 Peripheral vascular disease, unspecified: Secondary | ICD-10-CM | POA: Diagnosis not present

## 2015-08-31 DIAGNOSIS — E119 Type 2 diabetes mellitus without complications: Secondary | ICD-10-CM | POA: Diagnosis not present

## 2015-09-03 ENCOUNTER — Encounter: Payer: Self-pay | Admitting: Family Medicine

## 2015-09-15 ENCOUNTER — Ambulatory Visit: Payer: Medicare Other | Admitting: Family Medicine

## 2015-09-27 ENCOUNTER — Encounter: Payer: Self-pay | Admitting: Family Medicine

## 2015-09-27 ENCOUNTER — Ambulatory Visit (INDEPENDENT_AMBULATORY_CARE_PROVIDER_SITE_OTHER): Payer: Medicare Other | Admitting: Family Medicine

## 2015-09-27 DIAGNOSIS — E781 Pure hyperglyceridemia: Secondary | ICD-10-CM

## 2015-09-27 DIAGNOSIS — I10 Essential (primary) hypertension: Secondary | ICD-10-CM | POA: Diagnosis not present

## 2015-09-27 DIAGNOSIS — E1151 Type 2 diabetes mellitus with diabetic peripheral angiopathy without gangrene: Secondary | ICD-10-CM

## 2015-09-27 DIAGNOSIS — I6521 Occlusion and stenosis of right carotid artery: Secondary | ICD-10-CM

## 2015-09-27 DIAGNOSIS — D509 Iron deficiency anemia, unspecified: Secondary | ICD-10-CM

## 2015-09-27 DIAGNOSIS — Z5181 Encounter for therapeutic drug level monitoring: Secondary | ICD-10-CM

## 2015-09-27 DIAGNOSIS — Z853 Personal history of malignant neoplasm of breast: Secondary | ICD-10-CM

## 2015-09-27 DIAGNOSIS — E538 Deficiency of other specified B group vitamins: Secondary | ICD-10-CM | POA: Diagnosis not present

## 2015-09-27 DIAGNOSIS — Z720 Tobacco use: Secondary | ICD-10-CM | POA: Diagnosis not present

## 2015-09-27 LAB — CBC WITH DIFFERENTIAL/PLATELET
BASOS ABS: 106 {cells}/uL (ref 0–200)
Basophils Relative: 1 %
EOS ABS: 212 {cells}/uL (ref 15–500)
Eosinophils Relative: 2 %
HEMATOCRIT: 39.3 % (ref 35.0–45.0)
Hemoglobin: 13.1 g/dL (ref 11.7–15.5)
LYMPHS PCT: 20 %
Lymphs Abs: 2120 cells/uL (ref 850–3900)
MCH: 28.1 pg (ref 27.0–33.0)
MCHC: 33.3 g/dL (ref 32.0–36.0)
MCV: 84.3 fL (ref 80.0–100.0)
MONO ABS: 636 {cells}/uL (ref 200–950)
MPV: 10.4 fL (ref 7.5–12.5)
Monocytes Relative: 6 %
NEUTROS PCT: 71 %
Neutro Abs: 7526 cells/uL (ref 1500–7800)
Platelets: 267 10*3/uL (ref 140–400)
RBC: 4.66 MIL/uL (ref 3.80–5.10)
RDW: 17.9 % — AB (ref 11.0–15.0)
WBC: 10.6 10*3/uL (ref 3.8–10.8)

## 2015-09-27 LAB — COMPREHENSIVE METABOLIC PANEL
ALT: 10 U/L (ref 6–29)
AST: 12 U/L (ref 10–35)
Albumin: 4.3 g/dL (ref 3.6–5.1)
Alkaline Phosphatase: 72 U/L (ref 33–130)
BILIRUBIN TOTAL: 0.5 mg/dL (ref 0.2–1.2)
BUN: 20 mg/dL (ref 7–25)
CHLORIDE: 104 mmol/L (ref 98–110)
CO2: 22 mmol/L (ref 20–31)
CREATININE: 0.75 mg/dL (ref 0.50–0.99)
Calcium: 10 mg/dL (ref 8.6–10.4)
GLUCOSE: 124 mg/dL — AB (ref 65–99)
Potassium: 4.7 mmol/L (ref 3.5–5.3)
SODIUM: 140 mmol/L (ref 135–146)
Total Protein: 7.3 g/dL (ref 6.1–8.1)

## 2015-09-27 LAB — LIPID PANEL
CHOL/HDL RATIO: 4.3 ratio (ref <=5.0)
Cholesterol: 182 mg/dL (ref 125–200)
HDL: 42 mg/dL — AB (ref >=46)
LDL CALC: 79 mg/dL (ref <130)
Triglycerides: 306 mg/dL — ABNORMAL HIGH (ref <150)
VLDL: 61 mg/dL — AB (ref <30)

## 2015-09-27 NOTE — Patient Instructions (Addendum)
Take 500 or 1000 mcg vitamin B12 daily Let's check labs today  I do encourage you to quit smoking Call [phone] to sign up for smoking cessation classes You can call 1-800-QUIT-NOW to talk with a smoking cessation coach  Try to limit saturated fats in your diet (bologna, hot dogs, barbeque, cheeseburgers, hamburgers, steak, bacon, sausage, cheese, etc.) and get more fresh fruits, vegetables, and whole grains  Your goal blood pressure is less than 140 mmHg on top. Try to follow the DASH guidelines (DASH stands for Dietary Approaches to Stop Hypertension) Try to limit the sodium in your diet.  Ideally, consume less than 1.5 grams (less than 1,500mg ) per day. Do not add salt when cooking or at the table.  Check the sodium amount on labels when shopping, and choose items lower in sodium when given a choice. Avoid or limit foods that already contain a lot of sodium. Eat a diet rich in fruits and vegetables and whole grains.  DASH Eating Plan DASH stands for "Dietary Approaches to Stop Hypertension." The DASH eating plan is a healthy eating plan that has been shown to reduce high blood pressure (hypertension). Additional health benefits may include reducing the risk of type 2 diabetes mellitus, heart disease, and stroke. The DASH eating plan may also help with weight loss. WHAT DO I NEED TO KNOW ABOUT THE DASH EATING PLAN? For the DASH eating plan, you will follow these general guidelines:  Choose foods with a percent daily value for sodium of less than 5% (as listed on the food label).  Use salt-free seasonings or herbs instead of table salt or sea salt.  Check with your health care provider or pharmacist before using salt substitutes.  Eat lower-sodium products, often labeled as "lower sodium" or "no salt added."  Eat fresh foods.  Eat more vegetables, fruits, and low-fat dairy products.  Choose whole grains. Look for the word "whole" as the first word in the ingredient list.  Choose  fish and skinless chicken or [place] more often than red meat. Limit fish, poultry, and meat to 6 oz (170 g) each day.  Limit sweets, desserts, sugars, and sugary drinks.  Choose heart-healthy fats.  Limit cheese to 1 oz (28 g) per day.  Eat more home-cooked food and less restaurant, buffet, and fast food.  Limit fried foods.  Cook foods using methods other than frying.  Limit canned vegetables. If you do use them, rinse them well to decrease the sodium.  When eating at a restaurant, ask that your food be prepared with less salt, or no salt if possible. WHAT FOODS CAN I EAT? Seek help from a dietitian for individual calorie needs. Grains Whole grain or whole wheat bread. Brown rice. Whole grain or whole wheat pasta. Quinoa, bulgur, and whole grain cereals. Low-sodium cereals. Corn or whole wheat flour tortillas. Whole grain cornbread. Whole grain crackers. Low-sodium crackers. Vegetables Fresh or frozen vegetables (raw, steamed, roasted, or grilled). Low-sodium or reduced-sodium tomato and vegetable juices. Low-sodium or reduced-sodium tomato sauce and paste. Low-sodium or reduced-sodium canned vegetables.  Fruits All fresh, canned (in natural juice), or frozen fruits. Meat and Other Protein Products Ground beef (85% or leaner), grass-fed beef, or beef trimmed of fat. Skinless chicken or [place]. Ground chicken or [place]. Pork trimmed of fat. All fish and seafood. Eggs. Dried beans, peas, or lentils. Unsalted nuts and seeds. Unsalted canned beans. Dairy Low-fat dairy products, such as skim or 1% milk, 2% or reduced-fat cheeses, low-fat ricotta or cottage cheese, or  plain low-fat yogurt. Low-sodium or reduced-sodium cheeses. Fats and Oils Tub margarines without trans fats. Light or reduced-fat mayonnaise and salad dressings (reduced sodium). Avocado. Safflower, olive, or canola oils. Natural peanut or almond butter. Other Unsalted popcorn and pretzels. The items listed above may not be  a complete list of recommended foods or beverages. Contact your dietitian for more options. WHAT FOODS ARE NOT RECOMMENDED? Grains White bread. White pasta. White rice. Refined cornbread. Bagels and croissants. Crackers that contain trans fat. Vegetables Creamed or fried vegetables. Vegetables in a cheese sauce. Regular canned vegetables. Regular canned tomato sauce and paste. Regular tomato and vegetable juices. Fruits Dried fruits. Canned fruit in light or heavy syrup. Fruit juice. Meat and Other Protein Products Fatty cuts of meat. Ribs, chicken wings, bacon, sausage, bologna, salami, chitterlings, fatback, hot dogs, bratwurst, and packaged luncheon meats. Salted nuts and seeds. Canned beans with salt. Dairy Whole or 2% milk, cream, half-and-half, and cream cheese. Whole-fat or sweetened yogurt. Full-fat cheeses or blue cheese. Nondairy creamers and whipped toppings. Processed cheese, cheese spreads, or cheese curds. Condiments Onion and garlic salt, seasoned salt, table salt, and sea salt. Canned and packaged gravies. Worcestershire sauce. Tartar sauce. Barbecue sauce. Teriyaki sauce. Soy sauce, including reduced sodium. Steak sauce. Fish sauce. Oyster sauce. Cocktail sauce. Horseradish. Ketchup and mustard. Meat flavorings and tenderizers. Bouillon cubes. Hot sauce. Tabasco sauce. Marinades. Taco seasonings. Relishes. Fats and Oils Butter, stick margarine, lard, shortening, ghee, and bacon fat. Coconut, palm kernel, or palm oils. Regular salad dressings. Other Pickles and olives. Salted popcorn and pretzels. The items listed above may not be a complete list of foods and beverages to avoid. Contact your dietitian for more information. WHERE CAN I FIND MORE INFORMATION? National Heart, Lung, and Blood Institute: travelstabloid.com   This information is not intended to replace advice given to you by your health care provider. Make sure you discuss any  questions you have with your health care provider.   Document Released: 01/26/2011 Document Revised: 02/27/2014 Document Reviewed: 12/11/2012 Elsevier Interactive Patient Education 2016 [employer]. Smoking Cessation, Tips for Success If you are ready to quit smoking, congratulations! You have chosen to help yourself be healthier. Cigarettes bring nicotine, tar, carbon monoxide, and other irritants into your body. Your lungs, heart, and blood vessels will be able to work better without these poisons. There are many different ways to quit smoking. Nicotine gum, nicotine patches, a nicotine inhaler, or nicotine nasal spray can help with physical craving. Hypnosis, support groups, and medicines help break the habit of smoking. WHAT THINGS CAN I DO TO MAKE QUITTING EASIER?  Here are some tips to help you quit for good:  Pick a date when you will quit smoking completely. Tell all of your friends and family about your plan to quit on that date.  Do not try to slowly cut down on the number of cigarettes you are smoking. Pick a quit date and quit smoking completely starting on that day.  Throw away all cigarettes.   Clean and remove all ashtrays from your home, work, and car.  On a card, write down your reasons for quitting. Carry the card with you and read it when you get the urge to smoke.  Cleanse your body of nicotine. Drink enough water and fluids to keep your urine clear or pale yellow. Do this after quitting to flush the nicotine from your body.  Learn to predict your moods. Do not let a bad situation be your excuse to have a  cigarette. Some situations in your life might tempt you into wanting a cigarette.  Never have "just one" cigarette. It leads to wanting another and another. Remind yourself of your decision to quit.  Change habits associated with smoking. If you smoked while driving or when feeling stressed, try other activities to replace smoking. Stand up when drinking your coffee.  Brush your teeth after eating. Sit in a different chair when you read the paper. Avoid alcohol while trying to quit, and try to drink fewer caffeinated beverages. Alcohol and caffeine may urge you to smoke.  Avoid foods and drinks that can trigger a desire to smoke, such as sugary or spicy foods and alcohol.  Ask people who smoke not to smoke around you.  Have something planned to do right after eating or having a cup of coffee. For example, plan to take a walk or exercise.  Try a relaxation exercise to calm you down and decrease your stress. Remember, you may be tense and nervous for the first 2 weeks after you quit, but this will pass.  Find new activities to keep your hands busy. Play with a pen, coin, or rubber band. Doodle or draw things on paper.  Brush your teeth right after eating. This will help cut down on the craving for the taste of tobacco after meals. You can also try mouthwash.   Use oral substitutes in place of cigarettes. Try using lemon drops, carrots, cinnamon sticks, or chewing gum. Keep them handy so they are available when you have the urge to smoke.  When you have the urge to smoke, try deep breathing.  Designate your home as a nonsmoking area.  If you are a heavy smoker, ask your health care provider about a prescription for nicotine chewing gum. It can ease your withdrawal from nicotine.  Reward yourself. Set aside the cigarette money you save and buy yourself something nice.  Look for support from others. Join a support group or smoking cessation program. Ask someone at home or at work to help you with your plan to quit smoking.  Always ask yourself, "Do I need this cigarette or is this just a reflex?" Tell yourself, "Today, I choose not to smoke," or "I do not want to smoke." You are reminding yourself of your decision to quit.  Do not replace cigarette smoking with electronic cigarettes (commonly called e-cigarettes). The safety of e-cigarettes is unknown, and  some may contain harmful chemicals.  If you relapse, do not give up! Plan ahead and think about what you will do the next time you get the urge to smoke. HOW WILL I FEEL WHEN I QUIT SMOKING? You may have symptoms of withdrawal because your body is used to nicotine (the addictive substance in cigarettes). You may crave cigarettes, be irritable, feel very hungry, cough often, get headaches, or have difficulty concentrating. The withdrawal symptoms are only temporary. They are strongest when you first quit but will go away within 10-14 days. When withdrawal symptoms occur, stay in control. Think about your reasons for quitting. Remind yourself that these are signs that your body is healing and getting used to being without cigarettes. Remember that withdrawal symptoms are easier to treat than the major diseases that smoking can cause.  Even after the withdrawal is over, expect periodic urges to smoke. However, these cravings are generally short lived and will go away whether you smoke or not. Do not smoke! WHAT RESOURCES ARE AVAILABLE TO HELP ME QUIT SMOKING? Your health care provider  provider can direct you to community resources or hospitals for support, which may include:  Group support.  Education.  Hypnosis.  Therapy.   This information is not intended to replace advice given to you by your health care provider. Make sure you discuss any questions you have with your health care provider.   Document Released: 11/05/2003 Document Revised: 02/27/2014 Document Reviewed: 07/25/2012 Elsevier Interactive Patient Education 2016 Elsevier Inc.  

## 2015-09-27 NOTE — Assessment & Plan Note (Signed)
Check sgpt on statin, check platelets on plavix

## 2015-09-27 NOTE — Assessment & Plan Note (Signed)
Take 500 or 1000 mcg daily

## 2015-09-27 NOTE — Assessment & Plan Note (Signed)
Almost to goal; ideal systolic less than [id number] mmHg I explained; DASH guidelines; controlled at home

## 2015-09-27 NOTE — Assessment & Plan Note (Signed)
Check cholesterol; limit saturated fats 

## 2015-09-27 NOTE — Assessment & Plan Note (Signed)
mammo ordered in June

## 2015-09-27 NOTE — Assessment & Plan Note (Signed)
Managed by vascular; on plavix and statin; she is going to try to quit smoking again with my encouragement

## 2015-09-27 NOTE — Progress Notes (Signed)
BP 140/82   Pulse 64   Temp 97.5 F (36.4 C) (Oral)   Resp 14   Wt 153 lb (69.4 kg)   SpO2 97%   BMI 28.44 kg/m    Subjective:    Patient ID: Julia Cunningham, female    DOB: 09-14-1945, 70 y.o.   MRN: [id number]  HPI: Julia Cunningham is a 70 y.o. female  Chief Complaint  Patient presents with  . Follow-up   She fell since her last visit, had a fall; she was on vacation and went to [place]; they live in a condo with flights of steps; gate for kids close to the top step and unlocked the gate and went to setp down and slipped and fell; right leg bent behind her, slipped down 3 steps; more embarrassed; thankful it was carpeted  Here for fasting labs; she has high cholesterol; eating better now; 3-4 eggs a week; salami and other meats rarely; eating veggie burgers; no muscle aches on the statin  Type 2 diabetes; checking sugars now off of metformin; last night after dinner was 88; during the day, sometimes it's 130 to 140; sometimes gets low sugars; highest sugar in the last month was at son's house, on vacation over 200, but just once after big [place] dinner  She managed to quit smoking for 3 days but picked back up, smoked around her son, was on vacation; she may try to quit again  Seeing vascular doctor; carotids stable; no bleeding on plavix; she was anemic but that resolved, that was a stomach bleed and she took iron pills, but now taking iron pills maybe 3 times a week; she saw Dr. Vira Agar (GI) for her stomach, seeing pantoprazole; the nurse practitioner was pushing her to get a colonoscopy; NP told her she is not eligible for the cologuard; pushed her to get colonoscopy  She has HTN; checks at home, 120s and 130s over 54s and 70s  Depression screen [facility] 2/9 09/27/2015 08/09/2015 07/06/2015  Decreased Interest 0 0 0  Down, Depressed, Hopeless 0 0 0  PHQ - 2 Score 0 0 0   Relevant past medical, surgical, family and social history reviewed Past Medical History:  Diagnosis Date    . Aortic atherosclerosis ([place])   . Atherosclerosis of right carotid artery   . Breast cancer ([place])    Breast left   . Collagen vascular disease ([place])   . Controlled type 2 diabetes mellitus with circulatory disorder, without long-term current use of insulin ([place])   . Diabetes mellitus without complication ([place])   . GI bleed not requiring more than 4 units of blood in 24 hours, ICU, or surgery Aug 15-17, 2015   2 units PRBCs  . Hypertension   . Hypertriglyceridemia   . Personal history of tobacco use, presenting hazards to health 06/18/2015  . Tobacco abuse   . Vertebral artery occlusion    Past Surgical History:  Procedure Laterality Date  . BREAST SURGERY Left 02/21/1988   Mastectomy  . CAROTID ENDARTERECTOMY Left July 2015  . CHOLECYSTECTOMY  02/20/1978   Family History  Problem Relation Age of Onset  . Heart disease Mother     in her 97's  . Stroke Mother     8 yo  . Hypertension Mother   . Heart attack Mother   . Heart disease Father     70 yo  . Diabetes Father   . Asthma Father   . Heart attack Father   . Hypertension Father   .  Cancer Neg Hx   . COPD Neg Hx    Social History  Substance Use Topics  . Smoking status: Current Every Day Smoker    Packs/day: 1.00    Years: 50.00    Types: Cigarettes  . Smokeless tobacco: Never Used  . Alcohol use No   Interim medical history since last visit reviewed. Allergies and medications reviewed  Review of Systems Per HPI unless specifically indicated above     Objective:    BP 140/82   Pulse 64   Temp 97.5 F (36.4 C) (Oral)   Resp 14   Wt 153 lb (69.4 kg)   SpO2 97%   BMI 28.44 kg/m   Wt Readings from Last 3 Encounters:  09/27/15 153 lb (69.4 kg)  08/09/15 152 lb (68.9 kg)  07/06/15 149 lb (67.6 kg)    Physical Exam  Constitutional: She appears well-developed and well-nourished. No distress.  HENT:  Head: Normocephalic and atraumatic.  Eyes: EOM are normal. No scleral icterus.  Neck: Carotid bruit  is not present. No thyromegaly present.  Cardiovascular: Normal rate, regular rhythm and normal heart sounds.   No murmur heard. Pulmonary/Chest: Effort normal and breath sounds normal. No respiratory distress. She has no wheezes.  Abdominal: Soft. Bowel sounds are normal. She exhibits no distension.  Musculoskeletal: Normal range of motion. She exhibits no edema.       Right ankle: She exhibits no swelling, no ecchymosis and no deformity.  Neurological: She is alert. She exhibits normal muscle tone.  Skin: Skin is warm and dry. She is not diaphoretic. No pallor.  Healing abrasion right anterior shin  Psychiatric: She has a normal mood and affect. Her behavior is normal. Judgment and thought content normal. Her mood appears not anxious. She does not exhibit a depressed mood.   Diabetic Foot Form - Detailed   Diabetic Foot Exam - detailed Visual Foot Exam completed.:  Yes  Are the toenails long?:  No Are the toenails thick?:  No Are the toenails ingrown?:  No Normal Range of Motion:  Yes Pulse Foot Exam completed.:  Yes  Right Dorsalis Pedis:  Present, Diminished Left Dorsalis Pedis:  Present, Diminished  Sensory Foot Exam Completed.:  Yes Semmes-Weinstein Monofilament Test R Site 1-Great Toe:  Pos L Site 1-Great Toe:  Pos  R Site 4:  Pos L Site 4:  Pos  R Site 5:  Pos L Site 5:  Pos       Results for orders placed or performed in visit on 07/06/15  CBC with Differential/Platelet  Result Value Ref Range   WBC 12.2 (H) 3.4 - 10.8 x10E3/uL   RBC 4.93 3.77 - 5.28 x10E6/uL   Hemoglobin 11.7 11.1 - 15.9 g/dL   Hematocrit 37.7 34.0 - 46.6 %   MCV 77 (L) 79 - 97 fL   MCH 23.7 (L) 26.6 - 33.0 pg   MCHC 31.0 (L) 31.5 - 35.7 g/dL   RDW 18.1 (H) 12.3 - 15.4 %   Platelets 418 (H) 150 - 379 x10E3/uL   Neutrophils 70 %   Lymphs 22 %   Monocytes 6 %   Eos 1 %   Basos 1 %   Neutrophils Absolute 8.5 (H) 1.4 - 7.0 x10E3/uL   Lymphocytes Absolute 2.7 0.7 - 3.1 x10E3/uL   Monocytes  Absolute 0.7 0.1 - 0.9 x10E3/uL   EOS (ABSOLUTE) 0.2 0.0 - 0.4 x10E3/uL   Basophils Absolute 0.1 0.0 - 0.2 x10E3/uL   Immature Granulocytes 0 %  Immature Grans (Abs) 0.0 0.0 - 0.1 x10E3/uL  Iron and TIBC  Result Value Ref Range   Total Iron Binding Capacity 409 250 - 450 ug/dL   UIBC 372 (H) 118 - 369 ug/dL   Iron 37 27 - 139 ug/dL   Iron Saturation 9 (LL) 15 - 55 %  Reticulocytes  Result Value Ref Range   Retic Ct Pct 1.1 0.6 - 2.6 %      Assessment & Plan:   Problem List Items Addressed This Visit      Cardiovascular and Mediastinum   Essential hypertension, benign    Almost to goal; ideal systolic less than XX123456 mmHg I explained; DASH guidelines; controlled at home      Controlled type 2 diabetes mellitus with circulatory disorder, without long-term current use of insulin ([facility])    Foot exam by MD today; limit sweets and simple carbs; check A1c today      Relevant Orders   Hemoglobin A1c   Atherosclerosis of right carotid artery    Managed by vascular; on plavix and statin; she is going to try to quit smoking again with my encouragement        Digestive   B12 deficiency    Take 500 or 1000 mcg daily        Other   Tobacco abuse    Encouraged smoking cessation      Microcytic hypochromic anemia    Patient refused colonoscopy with GI; check CBC      Relevant Medications   ferrous sulfate 325 (65 FE) MG EC tablet   Other Relevant Orders   CBC with Differential/Platelet   Medication monitoring encounter    Check sgpt on statin, check platelets on plavix      Relevant Orders   CBC with Differential/Platelet   Comprehensive metabolic panel   Hypertriglyceridemia    Check cholesterol; limit saturated fats      Relevant Orders   Lipid panel   Hx of breast cancer    mammo ordered in June       Other Visit Diagnoses   None.     Follow up plan: Return in about 6 months (around 03/29/2016) for fasting labs and visit with Dr. Sanda Klein.  An after-visit  summary was printed and given to the patient at [place].  Please see the patient instructions which may contain other information and recommendations beyond what is mentioned above in the assessment and plan.  Meds ordered this encounter  Medications  . ferrous sulfate 325 (65 FE) MG EC tablet    Sig: Take by mouth.  . RA KRILL OIL 500 MG CAPS    Sig: Take by mouth.    Orders Placed This Encounter  Procedures  . CBC with Differential/Platelet  . Hemoglobin A1c  . Lipid panel  . Comprehensive metabolic panel

## 2015-09-27 NOTE — Assessment & Plan Note (Signed)
Encouraged smoking cessation 

## 2015-09-27 NOTE — Assessment & Plan Note (Signed)
Patient refused colonoscopy with GI; check CBC

## 2015-09-27 NOTE — Assessment & Plan Note (Signed)
Foot exam by MD today; limit sweets and simple carbs; check A1c today

## 2015-09-28 LAB — HEMOGLOBIN A1C

## 2015-09-29 ENCOUNTER — Telehealth: Payer: Self-pay

## 2015-09-29 NOTE — Telephone Encounter (Signed)
[facility] called states there was not enough blood taken to draw he A1c!

## 2015-09-30 NOTE — Telephone Encounter (Signed)
Please contact pt and have her submit more blood then; thank you

## 2015-10-01 NOTE — Telephone Encounter (Signed)
Left voice mail

## 2015-10-02 ENCOUNTER — Encounter: Payer: Self-pay | Admitting: Family Medicine

## 2015-10-02 DIAGNOSIS — E1151 Type 2 diabetes mellitus with diabetic peripheral angiopathy without gangrene: Secondary | ICD-10-CM

## 2015-10-05 MED ORDER — ICOSAPENT ETHYL 1 G PO CAPS: 2.0000 | ORAL_CAPSULE | Freq: Two times a day (BID) | ORAL | 1 refills | Status: DC

## 2015-10-05 NOTE — Assessment & Plan Note (Signed)
Check A1c, order was cancelled by lab

## 2015-10-05 NOTE — Telephone Encounter (Signed)
Order lab, will put in orders for both [facility] so patient can go to lab of her choice

## 2015-10-06 ENCOUNTER — Telehealth: Payer: Self-pay | Admitting: *Deleted

## 2015-10-06 NOTE — Telephone Encounter (Signed)
Contacted patient to inquire about cancelled LDCT lung cancer screening scan and to see if patient is interested in rescheduling. Patient verbalizes that she is interested.  Verified patient's age, no signs of lung cancer, no illness that would prevent patient from receiving treatment for lung cancer, and smoking history (current, 41pack year). Patient is expecting call from scheduling with appointment for screening scan and knows to call me with any questions.

## 2015-10-09 ENCOUNTER — Other Ambulatory Visit: Payer: Self-pay | Admitting: Family Medicine

## 2015-10-09 NOTE — Telephone Encounter (Signed)
I just sent a year's worth in June; one whole pill daily I declined this request, not needed and wrong instructions

## 2015-10-21 ENCOUNTER — Other Ambulatory Visit: Payer: Self-pay | Admitting: *Deleted

## 2015-10-21 DIAGNOSIS — Z87891 Personal history of nicotine dependence: Secondary | ICD-10-CM

## 2015-10-29 ENCOUNTER — Other Ambulatory Visit: Payer: Self-pay

## 2015-10-29 ENCOUNTER — Ambulatory Visit
Admission: RE | Admit: 2015-10-29 | Discharge: 2015-10-29 | Disposition: A | Discharge status: Home / Self Care | Payer: Medicare Other | Source: Ambulatory Visit | Attending: Oncology | Admitting: Oncology

## 2015-10-29 DIAGNOSIS — I700 Atherosclerosis of aorta: Secondary | ICD-10-CM | POA: Diagnosis not present

## 2015-10-29 DIAGNOSIS — E1151 Type 2 diabetes mellitus with diabetic peripheral angiopathy without gangrene: Secondary | ICD-10-CM | POA: Diagnosis not present

## 2015-10-29 DIAGNOSIS — I2510 Atherosclerotic heart disease of native coronary artery without angina pectoris: Secondary | ICD-10-CM | POA: Diagnosis not present

## 2015-10-29 DIAGNOSIS — Z87891 Personal history of nicotine dependence: Secondary | ICD-10-CM | POA: Insufficient documentation

## 2015-10-30 LAB — HEMOGLOBIN A1C
HEMOGLOBIN A1C: 7 % — AB (ref <5.7)
Mean Plasma Glucose: 154 mg/dL

## 2015-11-08 ENCOUNTER — Telehealth: Payer: Self-pay | Admitting: *Deleted

## 2015-11-08 NOTE — Telephone Encounter (Signed)
Notified patient of LDCT lung cancer screening results with recommendation for 12 month follow up imaging. Also notified of incidental finding noted below. Patient verbalizes understanding.   IMPRESSION: 1. Lung-RADS Category 2, benign appearance or behavior. Continue annual screening with low-dose chest CT without contrast in 12 months. 2.  Coronary artery atherosclerosis. Aortic atherosclerosis. 3. Left mastectomy, without evidence of metastatic disease.

## 2015-11-22 ENCOUNTER — Encounter: Payer: Self-pay | Admitting: Family Medicine

## 2015-12-14 ENCOUNTER — Other Ambulatory Visit (INDEPENDENT_AMBULATORY_CARE_PROVIDER_SITE_OTHER): Payer: Self-pay | Admitting: Vascular Surgery

## 2015-12-14 ENCOUNTER — Other Ambulatory Visit: Payer: Self-pay | Admitting: Family Medicine

## 2015-12-15 NOTE — Telephone Encounter (Signed)
rx approvd

## 2015-12-27 ENCOUNTER — Other Ambulatory Visit: Payer: Self-pay | Admitting: Family Medicine

## 2016-01-30 DIAGNOSIS — R350 Frequency of micturition: Secondary | ICD-10-CM | POA: Diagnosis not present

## 2016-01-30 DIAGNOSIS — N390 Urinary tract infection, site not specified: Secondary | ICD-10-CM | POA: Diagnosis not present

## 2016-03-04 ENCOUNTER — Other Ambulatory Visit: Payer: Self-pay | Admitting: Family Medicine

## 2016-03-08 NOTE — Telephone Encounter (Signed)
Last Cr, SGPT reviewed; Rxs approved

## 2016-03-29 ENCOUNTER — Ambulatory Visit: Payer: Medicare Other | Admitting: Family Medicine

## 2016-04-06 ENCOUNTER — Other Ambulatory Visit: Payer: Self-pay | Admitting: Family Medicine

## 2016-04-06 ENCOUNTER — Ambulatory Visit (INDEPENDENT_AMBULATORY_CARE_PROVIDER_SITE_OTHER): Payer: Medicare Other | Admitting: Family Medicine

## 2016-04-06 ENCOUNTER — Encounter: Payer: Self-pay | Admitting: Family Medicine

## 2016-04-06 VITALS — BP 140/78 | HR 70 | Temp 98.1°F | Resp 16 | Wt 155.4 lb

## 2016-04-06 DIAGNOSIS — I6521 Occlusion and stenosis of right carotid artery: Secondary | ICD-10-CM | POA: Diagnosis not present

## 2016-04-06 DIAGNOSIS — E1151 Type 2 diabetes mellitus with diabetic peripheral angiopathy without gangrene: Secondary | ICD-10-CM

## 2016-04-06 DIAGNOSIS — E781 Pure hyperglyceridemia: Secondary | ICD-10-CM | POA: Diagnosis not present

## 2016-04-06 DIAGNOSIS — I739 Peripheral vascular disease, unspecified: Secondary | ICD-10-CM

## 2016-04-06 DIAGNOSIS — I700 Atherosclerosis of aorta: Secondary | ICD-10-CM

## 2016-04-06 DIAGNOSIS — D509 Iron deficiency anemia, unspecified: Secondary | ICD-10-CM

## 2016-04-06 DIAGNOSIS — Z5181 Encounter for therapeutic drug level monitoring: Secondary | ICD-10-CM

## 2016-04-06 DIAGNOSIS — Z1159 Encounter for screening for other viral diseases: Secondary | ICD-10-CM | POA: Diagnosis not present

## 2016-04-06 DIAGNOSIS — I10 Essential (primary) hypertension: Secondary | ICD-10-CM

## 2016-04-06 NOTE — Patient Instructions (Signed)
Monitor your BP at home and call me if not consistently under 140 Check your feet every night We'll get labs

## 2016-04-06 NOTE — Assessment & Plan Note (Signed)
Foot exam by MD today; check A1c; consider adding another agent; did not tolerate metformin

## 2016-04-06 NOTE — Assessment & Plan Note (Signed)
Continue to try to keep LDL under 70; continue plavix; continue to f/u with Dr. Lucky Cowboy; limit saturated fats; patient is not ready to quit smoking; I am here to help if and when she is ready to quit

## 2016-04-06 NOTE — Progress Notes (Signed)
BP 140/78 (BP Location: Right Arm)   Pulse 70   Temp 98.1 F (36.7 C) (Oral)   Resp 16   Wt 155 lb 7 oz (70.5 kg)   SpO2 95%   BMI 28.43 kg/m    Subjective:    Patient ID: Julia Cunningham, female    DOB: March 11, 1945, 71 y.o.   MRN: [id number]  HPI: Julia Cunningham is a 71 y.o. female  Chief Complaint  Patient presents with  . Hypertension  . Diabetes  . Hyperlipidemia   Patient is here for follow-up Hypertension; checking BP away from the doctor; usually 130/60, highest is 140; everything is fine at home  PAD; seeing Dr. Lucky Cowboy; keeping feet warm; occasional claudication in LEFT calf, vascular doctor away; patient checking feet every night  Type 2 diabetes; check sugars at home; now off of metformin, cannot get below 140, mostly 140 and up She was getting bad pains and diarrhea with the metformin; on amaryl 2 mg; another med earlier prescribed was $300 range; gets low sugars if she doesn't eat; last A1c was 7  High TG; taking statin plus Vascepa Previous lipids reviewed; total chol 182, TG 306, HDL 42, LDL 79  Depression screen [facility] 2/9 04/06/2016 09/27/2015 08/09/2015 07/06/2015  Decreased Interest 0 0 0 0  Down, Depressed, Hopeless 0 0 0 0  PHQ - 2 Score 0 0 0 0   Relevant past medical, surgical, family and social history reviewed Past Medical History:  Diagnosis Date  . Aortic atherosclerosis ([place])   . Atherosclerosis of right carotid artery   . Breast cancer ([place])    Breast left   . Collagen vascular disease ([place])   . Controlled type 2 diabetes mellitus with circulatory disorder, without long-term current use of insulin ([place])   . Diabetes mellitus without complication ([place])   . GI bleed not requiring more than 4 units of blood in 24 hours, ICU, or surgery Aug 15-17, 2015   2 units PRBCs  . Hypertension   . Hypertriglyceridemia   . Personal history of tobacco use, presenting hazards to health 06/18/2015  . Tobacco abuse   . Vertebral artery occlusion    Past Surgical  History:  Procedure Laterality Date  . BREAST SURGERY Left 02/21/1988   Mastectomy  . CAROTID ENDARTERECTOMY Left July 2015  . CHOLECYSTECTOMY  02/20/1978   Family History  Problem Relation Age of Onset  . Heart disease Mother     in her 64's  . Stroke Mother     55 yo  . Hypertension Mother   . Heart attack Mother   . Heart disease Father     8 yo  . Diabetes Father   . Asthma Father   . Heart attack Father   . Hypertension Father   . Cancer Neg Hx   . COPD Neg Hx    Social History  Substance Use Topics  . Smoking status: Current Every Day Smoker    Packs/day: 1.00    Years: 50.00    Types: Cigarettes  . Smokeless tobacco: Never Used  . Alcohol use No    Interim medical history since last visit reviewed. Allergies and medications reviewed  Review of Systems Per HPI unless specifically indicated above     Objective:    BP 140/78 (BP Location: Right Arm)   Pulse 70   Temp 98.1 F (36.7 C) (Oral)   Resp 16   Wt 155 lb 7 oz (70.5 kg)   SpO2 95%   BMI  28.43 kg/m   Wt Readings from Last 3 Encounters:  04/06/16 155 lb 7 oz (70.5 kg)  10/29/15 151 lb (68.5 kg)  09/27/15 153 lb (69.4 kg)  MD note: patient tells me the first BP was 150, and the CMA rechecked it  Physical Exam  Constitutional: She appears well-developed and well-nourished. No distress.  HENT:  Head: Normocephalic and atraumatic.  Eyes: EOM are normal. No scleral icterus.  Neck: Carotid bruit is not present. No thyromegaly present.  Cardiovascular: Normal rate, regular rhythm and normal heart sounds.   No murmur heard. Pulmonary/Chest: Effort normal and breath sounds normal. No respiratory distress. She has no wheezes.  Abdominal: Soft. Bowel sounds are normal. She exhibits no distension.  Musculoskeletal: Normal range of motion. She exhibits no edema.       Right ankle: She exhibits no swelling, no ecchymosis and no deformity.  Neurological: She is alert. She exhibits normal muscle tone.    Skin: Skin is warm and dry. She is not diaphoretic. No pallor.  Psychiatric: She has a normal mood and affect. Her behavior is normal. Judgment and thought content normal. Her mood appears not anxious. She does not exhibit a depressed mood.   Diabetic Foot Form - Detailed   Diabetic Foot Exam - detailed Diabetic Foot exam was performed with the following findings:  Yes 04/06/2016  9:46 AM  Visual Foot Exam completed.:  Yes  Are the toenails ingrown?:  No Normal Range of Motion:  Yes Pulse Foot Exam completed.:  Yes  Right Dorsalis Pedis:  Diminished Left Dorsalis Pedis:  Diminished  Sensory Foot Exam Completed.:  Yes Swelling:  No Semmes-Weinstein Monofilament Test R Site 1-Great Toe:  Pos L Site 1-Great Toe:  Pos  R Site 4:  Pos L Site 4:  Pos  R Site 5:  Pos L Site 5:  Pos    Comments:  2nd toe LEFT foot purplish and cold; callus 3rd toe LEFT foot; other toes cool, distal feet mottled; seeing vascular     Results for orders placed or performed in visit on 04/06/16  Hepatitis C Antibody  Result Value Ref Range   HCV Ab NEGATIVE NEGATIVE      Assessment & Plan:   Problem List Items Addressed This Visit      Cardiovascular and Mediastinum   Peripheral vascular disease ([place]) (Chronic)    Managed by vascular; work to control lipids, pressure, weight; urged pt to quit smoking; I am here to help if/when she is ready      Essential hypertension, benign (Chronic)    Goal BP is less than 140; work on [employer] management, DASH guidelines; continue meds      Controlled type 2 diabetes mellitus with circulatory disorder, without long-term current use of insulin (HCC) - Primary (Chronic)    Foot exam by MD today; check A1c; consider adding another agent; did not tolerate metformin      Relevant Orders   Hemoglobin A1c   Atherosclerosis of right carotid artery (Chronic)    Continue to f/u with vascular; on plavix and statin; she unfortunately still smokes      Relevant Orders    Lipid panel   Aortic atherosclerosis (HCC) (Chronic)    Continue to try to keep LDL under 70; continue plavix; continue to f/u with Dr. Lucky Cowboy; limit saturated fats; patient is not ready to quit smoking; I am here to help if and when she is ready to quit      Relevant Orders  Lipid panel     Other   Microcytic hypochromic anemia    Recheck labs      Relevant Medications   vitamin B-12 (CYANOCOBALAMIN) 1000 MCG tablet   Other Relevant Orders   CBC with Differential/Platelet   Medication monitoring encounter    Check labs (sgpt on statin, Cr and K+ on ARB)      Relevant Orders   CBC with Differential/Platelet   Comprehensive metabolic panel   Hypertriglyceridemia    Last TG over 300; will recheck on statin plus vascepa      Relevant Orders   Lipid panel    Other Visit Diagnoses    Need for hepatitis C screening test       Relevant Orders   Hepatitis C Antibody (Completed)       Follow up plan: No Follow-up on file.  An after-visit summary was printed and given to the patient at [place].  Please see the patient instructions which may contain other information and recommendations beyond what is mentioned above in the assessment and plan.  Meds ordered this encounter  Medications  . vitamin B-12 (CYANOCOBALAMIN) 1000 MCG tablet    Sig: Take 1,000 mcg by mouth.     Orders Placed This Encounter  Procedures  . Hepatitis C Antibody  . Lipid panel  . Hemoglobin A1c  . CBC with Differential/Platelet  . Comprehensive metabolic panel

## 2016-04-06 NOTE — Assessment & Plan Note (Signed)
Continue to f/u with vascular; on plavix and statin; she unfortunately still smokes

## 2016-04-07 LAB — HEPATITIS C ANTIBODY: HCV AB: NEGATIVE

## 2016-04-11 ENCOUNTER — Encounter: Payer: Self-pay | Admitting: Family Medicine

## 2016-04-11 ENCOUNTER — Other Ambulatory Visit: Payer: Self-pay | Admitting: Family Medicine

## 2016-04-11 DIAGNOSIS — E1151 Type 2 diabetes mellitus with diabetic peripheral angiopathy without gangrene: Secondary | ICD-10-CM

## 2016-04-11 LAB — COMPLETE METABOLIC PANEL WITH GFR
ALBUMIN: 4.1 g/dL (ref 3.6–5.1)
ALK PHOS: 73 U/L (ref 33–130)
ALT: 9 U/L (ref 6–29)
AST: 11 U/L (ref 10–35)
BILIRUBIN TOTAL: 0.3 mg/dL (ref 0.2–1.2)
BUN: 20 mg/dL (ref 7–25)
CALCIUM: 9.7 mg/dL (ref 8.6–10.4)
CO2: 25 mmol/L (ref 20–31)
Chloride: 104 mmol/L (ref 98–110)
Creat: 0.98 mg/dL (ref 0.50–0.99)
GFR, EST AFRICAN AMERICAN: 69 mL/min (ref >=60)
GFR, EST NON AFRICAN AMERICAN: 59 mL/min — AB (ref >=60)
Glucose, Bld: 150 mg/dL — ABNORMAL HIGH (ref 65–99)
POTASSIUM: 4.6 mmol/L (ref 3.5–5.3)
SODIUM: 142 mmol/L (ref 135–146)
TOTAL PROTEIN: 7.2 g/dL (ref 6.1–8.1)

## 2016-04-11 LAB — LIPID PANEL
CHOL/HDL RATIO: 5.7 ratio — AB (ref <5.0)
Cholesterol: 189 mg/dL (ref <200)
HDL: 33 mg/dL — AB (ref >50)
LDL Cholesterol: 79 mg/dL (ref <100)
TRIGLYCERIDES: 385 mg/dL — AB (ref <150)
VLDL: 77 mg/dL — ABNORMAL HIGH (ref <30)

## 2016-04-11 MED ORDER — ICOSAPENT ETHYL 1 G PO CAPS
2.0000 | ORAL_CAPSULE | Freq: Two times a day (BID) | ORAL | 1 refills | Status: DC
Start: 2016-04-11 — End: 2016-08-03

## 2016-04-11 NOTE — Assessment & Plan Note (Signed)
Managed by vascular; work to control lipids, pressure, weight; urged pt to quit smoking; I am here to help if/when she is ready

## 2016-04-11 NOTE — Progress Notes (Signed)
Refill of vascepa sent in

## 2016-04-11 NOTE — Assessment & Plan Note (Signed)
Last TG over 300; will recheck on statin plus vascepa

## 2016-04-11 NOTE — Assessment & Plan Note (Signed)
Check labs (sgpt on statin, Cr and K+ on ARB)

## 2016-04-11 NOTE — Assessment & Plan Note (Signed)
Goal BP is less than 140; work on weight management, DASH guidelines; continue meds

## 2016-04-11 NOTE — Assessment & Plan Note (Signed)
Recheck labs 

## 2016-06-01 ENCOUNTER — Encounter: Payer: Self-pay | Admitting: Family Medicine

## 2016-06-18 ENCOUNTER — Encounter: Payer: Self-pay | Admitting: Family Medicine

## 2016-06-21 ENCOUNTER — Other Ambulatory Visit: Payer: Self-pay | Admitting: Family Medicine

## 2016-06-29 ENCOUNTER — Telehealth: Payer: Self-pay | Admitting: Family Medicine

## 2016-06-29 DIAGNOSIS — Z1239 Encounter for other screening for malignant neoplasm of breast: Secondary | ICD-10-CM

## 2016-06-29 NOTE — Telephone Encounter (Signed)
Patient's mammogram order from last year is expiring Please enter new mammo order and encourage her to have this done Thank you

## 2016-06-30 NOTE — Telephone Encounter (Signed)
Left detailed voicemail and ordered new!

## 2016-07-04 ENCOUNTER — Other Ambulatory Visit: Payer: Self-pay | Admitting: Family Medicine

## 2016-07-05 ENCOUNTER — Encounter: Payer: Self-pay | Admitting: Family Medicine

## 2016-07-05 NOTE — Telephone Encounter (Signed)
Please add atorvastatin to adverse reactions (myalgias) Thank you

## 2016-07-07 ENCOUNTER — Encounter: Payer: Self-pay | Admitting: Family Medicine

## 2016-07-07 ENCOUNTER — Other Ambulatory Visit (INDEPENDENT_AMBULATORY_CARE_PROVIDER_SITE_OTHER): Payer: Self-pay | Admitting: Vascular Surgery

## 2016-08-03 ENCOUNTER — Encounter: Payer: Self-pay | Admitting: Family Medicine

## 2016-08-03 ENCOUNTER — Telehealth: Payer: Self-pay

## 2016-08-03 ENCOUNTER — Ambulatory Visit (INDEPENDENT_AMBULATORY_CARE_PROVIDER_SITE_OTHER): Payer: Medicare Other | Admitting: Family Medicine

## 2016-08-03 VITALS — BP 146/78 | HR 88 | Temp 98.1°F | Resp 16 | Wt 159.0 lb

## 2016-08-03 DIAGNOSIS — E1151 Type 2 diabetes mellitus with diabetic peripheral angiopathy without gangrene: Secondary | ICD-10-CM | POA: Diagnosis not present

## 2016-08-03 DIAGNOSIS — R278 Other lack of coordination: Secondary | ICD-10-CM | POA: Diagnosis not present

## 2016-08-03 DIAGNOSIS — R42 Dizziness and giddiness: Secondary | ICD-10-CM | POA: Diagnosis not present

## 2016-08-03 DIAGNOSIS — I700 Atherosclerosis of aorta: Secondary | ICD-10-CM

## 2016-08-03 DIAGNOSIS — Z720 Tobacco use: Secondary | ICD-10-CM | POA: Diagnosis not present

## 2016-08-03 DIAGNOSIS — E781 Pure hyperglyceridemia: Secondary | ICD-10-CM | POA: Diagnosis not present

## 2016-08-03 DIAGNOSIS — I6521 Occlusion and stenosis of right carotid artery: Secondary | ICD-10-CM

## 2016-08-03 DIAGNOSIS — R011 Cardiac murmur, unspecified: Secondary | ICD-10-CM | POA: Diagnosis not present

## 2016-08-03 DIAGNOSIS — I10 Essential (primary) hypertension: Secondary | ICD-10-CM

## 2016-08-03 DIAGNOSIS — R0989 Other specified symptoms and signs involving the circulatory and respiratory systems: Secondary | ICD-10-CM | POA: Diagnosis not present

## 2016-08-03 DIAGNOSIS — I6509 Occlusion and stenosis of unspecified vertebral artery: Secondary | ICD-10-CM

## 2016-08-03 LAB — CBC WITH DIFFERENTIAL/PLATELET
BASOS ABS: 0 {cells}/uL (ref 0–200)
Basophils Relative: 0 %
EOS PCT: 1 %
Eosinophils Absolute: 113 cells/uL (ref 15–500)
HEMATOCRIT: 40.6 % (ref 35.0–45.0)
HEMOGLOBIN: 13.1 g/dL (ref 11.7–15.5)
LYMPHS ABS: 2034 {cells}/uL (ref 850–3900)
Lymphocytes Relative: 18 %
MCH: 27.9 pg (ref 27.0–33.0)
MCHC: 32.3 g/dL (ref 32.0–36.0)
MCV: 86.6 fL (ref 80.0–100.0)
MPV: 10 fL (ref 7.5–12.5)
Monocytes Absolute: 678 cells/uL (ref 200–950)
Monocytes Relative: 6 %
NEUTROS ABS: 8475 {cells}/uL — AB (ref 1500–7800)
Neutrophils Relative %: 75 %
Platelets: 314 10*3/uL (ref 140–400)
RBC: 4.69 MIL/uL (ref 3.80–5.10)
RDW: 14.3 % (ref 11.0–15.0)
WBC: 11.3 10*3/uL — ABNORMAL HIGH (ref 3.8–10.8)

## 2016-08-03 MED ORDER — ASPIRIN EC 325 MG PO TBEC: 325.0000 mg | DELAYED_RELEASE_TABLET | Freq: Every day | ORAL | 0 refills | Status: DC

## 2016-08-03 NOTE — Patient Instructions (Addendum)
Please have the scans done as soon as you agree I do encourage you to quit smoking Call [phone] to sign up for smoking cessation classes You can call 1-800-QUIT-NOW to talk with a smoking cessation coach Please call 911 if you develop new signs or symptoms of stroke We'll get labs today Add 325 mg coated aspirin daily in addition to the Plavix (clopidogrel) Call me with any concerns

## 2016-08-03 NOTE — Assessment & Plan Note (Signed)
She is not ready to quit, I am here to help if/when ready

## 2016-08-03 NOTE — Assessment & Plan Note (Signed)
Ordering MRI/MRA brain with circle of Willis, along with CTA neck; add aspirin 325 mg to plavix; see vascular specialist; to ER call 911 if worse

## 2016-08-03 NOTE — Assessment & Plan Note (Signed)
Present; patient does not want to address right now, has enough going on

## 2016-08-03 NOTE — Telephone Encounter (Signed)
I tried to contact this patient to inform her that she has been scheduled to have her imaging (CT & MRA) done tomorrow, Friday, August 14, 2016 at 1pm at [facility]), but there was no answer.  A message was left for her stating that I know she wanted to have it performed the first of the week but since it was put in as STAT the latest they would let me schedule it was tomorrow (08/04/16). I also mentioned that if the scheduled date/time was an inconvenience for her to feel free to give scheduling a call at [phone].

## 2016-08-03 NOTE — Assessment & Plan Note (Signed)
Check lipids 

## 2016-08-03 NOTE — Progress Notes (Signed)
BP (!) 146/78   Pulse 88   Temp 98.1 F (36.7 C) (Oral)   Resp 16   Wt 159 lb (72.1 kg)   SpO2 97%   BMI 29.08 kg/m    Subjective:    Patient ID: Julia Cunningham, female    DOB: 11/13/1945, 71 y.o.   MRN: [id number]  HPI: Julia Cunningham is a 71 y.o. female  Chief Complaint  Patient presents with  . Ear Pain  . Dizziness    with vomiting   HPI Woke up Saturday, fell against the wall going to the bathroom Slept for two days and didn't eat Vomiting from being so dizzy Room was spinning; as soon as she laid donw, everything spinning Bending down, spnning First two days were the worse Going on now a week The right tragus got hard and painful; sore inside, then wet; called an ENT and they can't see her until July 3rd Neuralgia on the left side, stabbing pain and very sensitive on the left; just stabbing; lasted two days, usually lasts a week; runs in the family; mother and cousins on father's side She sees vascular next month Not on any statin right now; was bothered with pins and needles and pains which she thought was from the statin;stopped the medicine and it all went away Last CT angio was May 2016 CLINICAL DATA:  71 year old hypertensive diabetic female with history of smoking and collagen vascular disease. Left carotid endarterectomy 10 months ago. Currently no complaints. History of breast cancer post mastectomy 1980s. Subsequent encounter.  EXAM: CT ANGIOGRAPHY NECK  TECHNIQUE: Multidetector CT imaging of the neck was performed using the standard protocol during bolus administration of intravenous contrast. Multiplanar CT image reconstructions and MIPs were obtained to evaluate the vascular anatomy. Carotid stenosis measurements (when applicable) are obtained utilizing NASCET criteria, using the distal internal carotid diameter as the denominator.  CONTRAST:  19mL OMNIPAQUE IOHEXOL 350 MG/ML SOLN  COMPARISON:  07/17/2013 CT angiogram.  FINDINGS: Aortic  arch: Atherosclerotic type changes aortic arch with mild ectasia. Common origin of the innominate and left common carotid artery. Aberrant right subclavian artery.  Right carotid system: Chronically occluded right internal carotid artery without reconstitution of flow.  Left carotid system: Post left carotid endarterectomy. Mild narrowing proximal anastomosis. 55% diameter stenosis distal anastomosis where left internal carotid artery measures 1.7 mm and just distal to this level 3.8 mm. Calcified plaque left internal carotid artery cavernous segment with mild narrowing.  Posterior circulation:  Aberrant right subclavian artery with significant irregular plaque and mild to moderate narrowing proximal to the takeoff of the right vertebral artery.  Proximal right vertebral artery is occluded. Collateral flow noted within majority of the cervical segment of the right vertebral artery.  Prominent irregular plaque with moderate to marked narrowing proximal left subclavian artery proximal to the takeoff of the left vertebral artery.  Mild narrowing origin of the left vertebral artery. Fold with mild narrowing just beyond the origin of the left vertebral artery. Minimal plaque vertical cervical segment left vertebral artery.  Skeleton: Cervical spondylotic changes most notable C6-7.  Other neck: Pulmonary parenchymal changes stable without dominant worrisome mass identified.  Chronic partial opacification inferior left mastoid air cells. Slight indistinctness of the septum unchanged. No discrete mass causing eustachian tube dysfunction noted.  Heterogeneous thyroid gland containing nodules measuring up to 2 cm on the right and 1.3 cm on left similar to prior exam. This can be followed/evaluated with thyroid ultrasound.  Scattered normal to top-normal size lymph  nodes throughout the neck. Some of these appear new or slightly larger than on prior exam. These lymph  nodes are more notable on the left (level 1 through level four). Index lymph node left level 2 region with short axis dimension of 7.5 mm and left level 4 region with short axis dimension of 6.5 mm.  IMPRESSION: Post left carotid endarterectomy. Mild narrowing proximal anastomosis. 55% diameter stenosis distal anastomosis where left internal carotid artery measures 1.7 mm and just distal to this level 3.8 mm. Calcified plaque left internal carotid artery cavernous segment with mild narrowing.  Chronically occluded right internal carotid artery.  Aberrant right subclavian artery with significant irregular plaque and mild to moderate narrowing proximal to the takeoff of the right vertebral artery.  Proximal right vertebral artery is occluded. Collateral flow noted within majority of the cervical segment of the right vertebral artery.  Prominent irregular plaque with moderate to marked narrowing proximal left subclavian artery proximal to the takeoff of the left vertebral artery.  Mild narrowing proximal left vertebral artery.  Chronic partial opacification inferior left mastoid air cells. Slight indistinctness of the septum unchanged.  Heterogeneous thyroid gland containing nodules measuring up to 2 cm on the right and 1.3 cm on left similar to prior exam. This can be followed/evaluated with thyroid ultrasound.  Scattered normal to top-normal size lymph nodes throughout the neck. Some of these appear new or slightly larger than on prior exam. These lymph nodes are more notable on the left (level 1 through level four). Index lymph node left level 2 region with short axis dimension of 7.5 mm and left level 4 region with short axis dimension of 6.5 mm.   Electronically Signed   By: Genia Del M.D.   On: 07/13/2014 13:48  Had a banana and a cup of coffee about noon or so (1.5 hours ago) Tried vaping, but made her want to smoke  June 25th -   Depression  screen [facility] 2/9 08/03/2016 04/06/2016 09/27/2015 08/09/2015 07/06/2015  Decreased Interest 0 0 0 0 0  Down, Depressed, Hopeless 0 0 0 0 0  PHQ - 2 Score 0 0 0 0 0    Relevant past medical, surgical, family and social history reviewed Past Medical History:  Diagnosis Date  . Aortic atherosclerosis ([place])   . Atherosclerosis of right carotid artery   . Breast cancer ([place])    Breast left   . Collagen vascular disease ([place])   . Controlled type 2 diabetes mellitus with circulatory disorder, without long-term current use of insulin ([place])   . Diabetes mellitus without complication ([place])   . GI bleed not requiring more than 4 units of blood in 24 hours, ICU, or surgery Aug 15-17, 2015   2 units PRBCs  . Hypertension   . Hypertriglyceridemia   . Personal history of tobacco use, presenting hazards to health 06/18/2015  . Tobacco abuse   . Vertebral artery occlusion    Past Surgical History:  Procedure Laterality Date  . BREAST SURGERY Left 02/21/1988   Mastectomy  . CAROTID ENDARTERECTOMY Left July 2015  . CHOLECYSTECTOMY  02/20/1978   Family History  Problem Relation Age of Onset  . Heart disease Mother        in her 48's  . Stroke Mother        55 yo  . Hypertension Mother   . Heart attack Mother   . Heart disease Father        74 yo  . Diabetes Father   .  Asthma Father   . Heart attack Father   . Hypertension Father   . Cancer Neg Hx   . COPD Neg Hx    Social History   Social History  . Marital status: Married    Spouse name: N/A  . Number of children: N/A  . Years of education: N/A   Occupational History  . Not on file.   Social History Main Topics  . Smoking status: Current Every Day Smoker    Packs/day: 1.00    Years: 50.00    Types: Cigarettes  . Smokeless tobacco: Never Used  . Alcohol use No  . Drug use: No  . Sexual activity: Yes   Other Topics Concern  . Not on file   Social History Narrative  . No narrative on file    Interim medical history since last  visit reviewed. Allergies and medications reviewed  Review of Systems Per HPI unless specifically indicated above     Objective:    BP (!) 146/78   Pulse 88   Temp 98.1 F (36.7 C) (Oral)   Resp 16   Wt 159 lb (72.1 kg)   SpO2 97%   BMI 29.08 kg/m   Wt Readings from Last 3 Encounters:  08/03/16 159 lb (72.1 kg)  04/06/16 155 lb 7 oz (70.5 kg)  10/29/15 151 lb (68.5 kg)    Physical Exam  Constitutional: She appears well-developed and well-nourished. No distress.  HENT:  Head: Normocephalic and atraumatic.  Eyes: EOM are normal. No scleral icterus.  Neck: Carotid bruit is present (LEFT; surgical scar present). No thyromegaly present.  Cardiovascular: Normal rate, regular rhythm and normal heart sounds.   No murmur heard. Toes are cool and violaceous  Pulmonary/Chest: Effort normal and breath sounds normal. No respiratory distress. She has no wheezes.  Abdominal: Soft. Bowel sounds are normal. She exhibits no distension.  Musculoskeletal: Normal range of motion. She exhibits no edema.       Right ankle: She exhibits no swelling, no ecchymosis and no deformity.  Neurological: She is alert. She exhibits normal muscle tone.  No dysdiadochokinesis; normal gait; equivocal dysmetria; strength 5/5  Skin: Skin is warm and dry. She is not diaphoretic. No pallor.  Psychiatric: She has a normal mood and affect. Her behavior is normal. Judgment and thought content normal. Her mood appears not anxious. She does not exhibit a depressed mood.      Assessment & Plan:   Problem List Items Addressed This Visit      Cardiovascular and Mediastinum   Vertebral artery occlusion    Ordering MRI/MRA brain with circle of Willis, along with CTA neck; add aspirin 325 mg to plavix; see vascular specialist; to ER call 911 if worse      Relevant Medications   aspirin EC 325 MG tablet   Other Relevant Orders   MR MRA HEAD WO CONTRAST   Essential hypertension, benign (Chronic)    Taking medicine  and comes up at the doctor, DASH guidelines      Relevant Medications   aspirin EC 325 MG tablet   Other Relevant Orders   COMPLETE METABOLIC PANEL WITH GFR (Completed)   Controlled type 2 diabetes mellitus with circulatory disorder, without long-term current use of insulin (HCC) (Chronic)    Check feet today, no problems      Relevant Medications   aspirin EC 325 MG tablet   Other Relevant Orders   Hemoglobin A1C (Completed)   Atherosclerosis of right carotid artery (Chronic)  Relevant Medications   aspirin EC 325 MG tablet   Other Relevant Orders   Lipid panel (Completed)   CBC with Differential/Platelet (Completed)   MR MRA HEAD WO CONTRAST   Aortic atherosclerosis (HCC) - Primary (Chronic)   Relevant Medications   aspirin EC 325 MG tablet   Other Relevant Orders   Lipid panel (Completed)   CBC with Differential/Platelet (Completed)     Nervous and Auditory   Cerebellar dysmetria   Relevant Orders   MR MRA HEAD WO CONTRAST     Other   Tobacco abuse (Chronic)    She is not ready to quit, I am here to help if/when ready      Left carotid bruit    Spoke with Dr. Ronalee Belts; add aspirin 325 mg daily with plavix per vascular      Relevant Orders   CBC with Differential/Platelet (Completed)   MR MRA HEAD WO CONTRAST   CT ANGIO NECK W OR WO CONTRAST   Hypertriglyceridemia    Check lipids      Relevant Medications   aspirin EC 325 MG tablet   Other Relevant Orders   Lipid panel (Completed)   Cardiac murmur    Present; patient does not want to address right now, has enough going on      Relevant Orders   ECHOCARDIOGRAM COMPLETE    Other Visit Diagnoses    Dizziness       Relevant Orders   MR MRA HEAD WO CONTRAST      Follow up plan: Return in about 7 days (around 08/10/2016) for follow-up visit with Dr. Sanda Klein.  An after-visit summary was printed and given to the patient at [place].  Please see the patient instructions which may contain other information  and recommendations beyond what is mentioned above in the assessment and plan.  Meds ordered this encounter  Medications  . DISCONTD: Krill Oil 500 MG CAPS    Sig: Take 500 mg by mouth daily.  . Garlic Oil 474 MG TABS    Sig: Take 500 mg by mouth daily.  Marland Kitchen aspirin EC 325 MG tablet    Sig: Take 1 tablet (325 mg total) by mouth daily.    Dispense:  30 tablet    Refill:  0    Orders Placed This Encounter  Procedures  . MR MRA HEAD WO CONTRAST  . CT ANGIO NECK W OR WO CONTRAST  . Lipid panel  . CBC with Differential/Platelet  . COMPLETE METABOLIC PANEL WITH GFR  . Hemoglobin A1C  . ECHOCARDIOGRAM COMPLETE   Face-to-face time with patient was more than 25 minutes, >50% time spent counseling and coordination of care

## 2016-08-03 NOTE — Assessment & Plan Note (Signed)
Taking medicine and comes up at the doctor, DASH guidelines

## 2016-08-03 NOTE — Assessment & Plan Note (Signed)
Spoke with Dr. Ronalee Belts; add aspirin 325 mg daily with plavix per vascular

## 2016-08-03 NOTE — Assessment & Plan Note (Signed)
Check feet today, no problems

## 2016-08-04 ENCOUNTER — Ambulatory Visit: Payer: Medicare Other

## 2016-08-04 ENCOUNTER — Ambulatory Visit: Payer: Medicare Other | Attending: Family Medicine

## 2016-08-04 DIAGNOSIS — R42 Dizziness and giddiness: Secondary | ICD-10-CM | POA: Insufficient documentation

## 2016-08-04 LAB — COMPLETE METABOLIC PANEL WITH GFR
ALBUMIN: 4.1 g/dL (ref 3.6–5.1)
ALK PHOS: 74 U/L (ref 33–130)
ALT: 11 U/L (ref 6–29)
AST: 11 U/L (ref 10–35)
BILIRUBIN TOTAL: 0.4 mg/dL (ref 0.2–1.2)
BUN: 15 mg/dL (ref 7–25)
CO2: 24 mmol/L (ref 20–31)
Calcium: 9.7 mg/dL (ref 8.6–10.4)
Chloride: 102 mmol/L (ref 98–110)
Creat: 0.96 mg/dL (ref 0.50–0.99)
GFR, Est African American: 70 mL/min (ref >=60)
GFR, Est Non African American: 61 mL/min (ref >=60)
GLUCOSE: 154 mg/dL — AB (ref 65–99)
Potassium: 4.7 mmol/L (ref 3.5–5.3)
SODIUM: 139 mmol/L (ref 135–146)
TOTAL PROTEIN: 7.3 g/dL (ref 6.1–8.1)

## 2016-08-04 LAB — LIPID PANEL
Cholesterol: 299 mg/dL — ABNORMAL HIGH (ref <200)
HDL: 34 mg/dL — ABNORMAL LOW (ref >50)
TRIGLYCERIDES: 426 mg/dL — AB (ref <150)
Total CHOL/HDL Ratio: 8.8 Ratio — ABNORMAL HIGH (ref <5.0)

## 2016-08-04 LAB — HEMOGLOBIN A1C
Hgb A1c MFr Bld: 7.7 % — ABNORMAL HIGH (ref <5.7)
Mean Plasma Glucose: 174 mg/dL

## 2016-08-04 NOTE — Assessment & Plan Note (Signed)
Patient wants to see ENT 

## 2016-08-07 ENCOUNTER — Other Ambulatory Visit: Payer: Self-pay | Admitting: Family Medicine

## 2016-08-07 DIAGNOSIS — Z5181 Encounter for therapeutic drug level monitoring: Secondary | ICD-10-CM

## 2016-08-07 DIAGNOSIS — I700 Atherosclerosis of aorta: Secondary | ICD-10-CM

## 2016-08-07 DIAGNOSIS — E785 Hyperlipidemia, unspecified: Secondary | ICD-10-CM | POA: Insufficient documentation

## 2016-08-07 DIAGNOSIS — E781 Pure hyperglyceridemia: Secondary | ICD-10-CM

## 2016-08-07 MED ORDER — PITAVASTATIN CALCIUM 2 MG PO TABS: ORAL_TABLET | ORAL | 1 refills | Status: DC

## 2016-08-07 MED ORDER — GLIMEPIRIDE 4 MG PO TABS: 4.0000 mg | ORAL_TABLET | Freq: Every day | ORAL | 3 refills | Status: DC

## 2016-08-07 NOTE — Telephone Encounter (Signed)
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

## 2016-08-07 NOTE — Progress Notes (Signed)
New Rxs sent locally to [facility]

## 2016-08-07 NOTE — Progress Notes (Signed)
Start livalo; check labs in 6 weeks Increase dm med

## 2016-08-07 NOTE — Assessment & Plan Note (Signed)
Add livalo; did not tolerate atorvastatin

## 2016-08-08 ENCOUNTER — Encounter: Payer: Self-pay | Admitting: Family Medicine

## 2016-08-08 ENCOUNTER — Telehealth: Payer: Self-pay | Admitting: Family Medicine

## 2016-08-08 ENCOUNTER — Ambulatory Visit (INDEPENDENT_AMBULATORY_CARE_PROVIDER_SITE_OTHER): Payer: Medicare Other | Admitting: Vascular Surgery

## 2016-08-08 MED ORDER — ROSUVASTATIN CALCIUM 10 MG PO TABS: 10.0000 mg | ORAL_TABLET | Freq: Every day | ORAL | 1 refills | Status: DC

## 2016-08-08 NOTE — Telephone Encounter (Signed)
Insurance won't cover livalo; insurance wants crestor; patient is willing to take per [employer]; new Rx sent

## 2016-08-09 DIAGNOSIS — R42 Dizziness and giddiness: Secondary | ICD-10-CM | POA: Diagnosis not present

## 2016-08-09 DIAGNOSIS — H8112 Benign paroxysmal vertigo, left ear: Secondary | ICD-10-CM | POA: Diagnosis not present

## 2016-08-09 DIAGNOSIS — H60541 Acute eczematoid otitis externa, right ear: Secondary | ICD-10-CM | POA: Diagnosis not present

## 2016-08-09 DIAGNOSIS — H6121 Impacted cerumen, right ear: Secondary | ICD-10-CM | POA: Diagnosis not present

## 2016-08-09 MED ORDER — ROSUVASTATIN CALCIUM 10 MG PO TABS: 10.0000 mg | ORAL_TABLET | Freq: Every day | ORAL | 0 refills | Status: DC

## 2016-08-09 NOTE — Telephone Encounter (Signed)
Roselyn Reef, can you please help with this? I was told Crestor was preferred by her insurance company She absolutely needs something to lower her lipids Thank you

## 2016-08-10 ENCOUNTER — Encounter: Payer: Self-pay | Admitting: Family Medicine

## 2016-08-10 ENCOUNTER — Encounter: Payer: Medicare Other | Admitting: Family Medicine

## 2016-08-24 ENCOUNTER — Encounter: Payer: Self-pay | Admitting: Family Medicine

## 2016-08-24 MED ORDER — PANTOPRAZOLE SODIUM 40 MG PO TBEC: 40.0000 mg | DELAYED_RELEASE_TABLET | Freq: Every day | ORAL | 1 refills | Status: DC

## 2016-08-24 MED ORDER — GLIMEPIRIDE 4 MG PO TABS: 4.0000 mg | ORAL_TABLET | Freq: Every day | ORAL | 3 refills | Status: AC

## 2016-08-30 ENCOUNTER — Encounter: Payer: Self-pay | Admitting: Family Medicine

## 2016-08-31 ENCOUNTER — Other Ambulatory Visit (INDEPENDENT_AMBULATORY_CARE_PROVIDER_SITE_OTHER): Payer: Self-pay | Admitting: Vascular Surgery

## 2016-08-31 DIAGNOSIS — I6523 Occlusion and stenosis of bilateral carotid arteries: Secondary | ICD-10-CM

## 2016-09-01 ENCOUNTER — Encounter (INDEPENDENT_AMBULATORY_CARE_PROVIDER_SITE_OTHER): Payer: Self-pay | Admitting: Vascular Surgery

## 2016-09-01 ENCOUNTER — Ambulatory Visit (INDEPENDENT_AMBULATORY_CARE_PROVIDER_SITE_OTHER): Payer: Medicare Other | Admitting: Vascular Surgery

## 2016-09-01 ENCOUNTER — Ambulatory Visit (INDEPENDENT_AMBULATORY_CARE_PROVIDER_SITE_OTHER): Payer: Medicare Other

## 2016-09-01 VITALS — BP 138/60 | HR 59 | Resp 15 | Ht 61.0 in | Wt 159.0 lb

## 2016-09-01 DIAGNOSIS — E1151 Type 2 diabetes mellitus with diabetic peripheral angiopathy without gangrene: Secondary | ICD-10-CM | POA: Diagnosis not present

## 2016-09-01 DIAGNOSIS — I6523 Occlusion and stenosis of bilateral carotid arteries: Secondary | ICD-10-CM

## 2016-09-01 DIAGNOSIS — E785 Hyperlipidemia, unspecified: Secondary | ICD-10-CM | POA: Diagnosis not present

## 2016-09-01 DIAGNOSIS — I10 Essential (primary) hypertension: Secondary | ICD-10-CM

## 2016-09-01 DIAGNOSIS — I6529 Occlusion and stenosis of unspecified carotid artery: Secondary | ICD-10-CM | POA: Insufficient documentation

## 2016-09-01 NOTE — Assessment & Plan Note (Signed)
lipid control important in reducing the progression of atherosclerotic disease. Continue statin therapy  

## 2016-09-01 NOTE — Progress Notes (Signed)
MRN : [id number]  Julia Cunningham is a 71 y.o. (1945/11/24) female who presents with chief complaint of  Chief Complaint  Patient presents with  . Carotid    One year carotid follow up  .  History of Present Illness: Patient returns in follow-up of her carotid disease. She is about 3 years status post carotid endarterectomy. She had a recent bout with vertigo but no focal neurologic symptoms. Specifically, the patient denies amaurosis fugax, speech or swallowing difficulties, or arm or leg weakness or numbness. Her carotid duplex today demonstrates a known right carotid artery occlusion with velocities that are actually improved in her left carotid endarterectomy site and would fall in the moderate range today.  Current Outpatient Prescriptions  Medication Sig Dispense Refill  . amLODipine (NORVASC) 5 MG tablet TAKE 1 TABLET (5 MG TOTAL) BY MOUTH DAILY. FOR BLOOD PRESSURE 90 tablet 3  . clopidogrel (PLAVIX) 75 MG tablet TAKE 1 TABLET EVERY DAY 90 tablet 1  . Garlic Oil 509 MG TABS Take 500 mg by mouth daily.    Marland Kitchen glimepiride (AMARYL) 4 MG tablet Take 1 tablet (4 mg total) by mouth daily before breakfast. 90 tablet 3  . glucose blood (TRUETEST TEST) test strip Check fingerstick sugars once a day on average; Dx E11.9; LON 99 months 100 each 1  . Krill Oil 500 MG CAPS Take 1 capsule (500 mg total) by mouth 2 (two) times daily.    . Lancets 30G MISC Reli-On brand requested by patient; check fingerstick sugars once a day on average; Dx E11.9 100 each 1  . losartan (COZAAR) 100 MG tablet Take 1 tablet (100 mg total) by mouth daily. 90 tablet 1  . meclizine (ANTIVERT) 25 MG tablet     . metoprolol tartrate (LOPRESSOR) 50 MG tablet Take 1 tablet (50 mg total) by mouth 2 (two) times daily. (Patient taking differently: Take 50 mg by mouth daily. ) 180 tablet 1  . mometasone (ELOCON) 0.1 % lotion     . pantoprazole (PROTONIX) 40 MG tablet Take 1 tablet (40 mg total) by mouth daily. 90 tablet 1  .  rosuvastatin (CRESTOR) 10 MG tablet Take 1 tablet (10 mg total) by mouth at bedtime. For cholesterol 90 tablet 0  . triamcinolone cream (KENALOG) 0.1 % Apply 1 application topically daily as needed (external ears).    Marland Kitchen aspirin EC 325 MG tablet Take 1 tablet (325 mg total) by mouth daily. (Patient not taking: Reported on 09/01/2016) 30 tablet 0   No current facility-administered medications for this visit.     Past Medical History:  Diagnosis Date  . Aortic atherosclerosis ([place])   . Atherosclerosis of right carotid artery   . Breast cancer ([place])    Breast left   . Collagen vascular disease ([place])   . Controlled type 2 diabetes mellitus with circulatory disorder, without long-term current use of insulin ([place])   . Diabetes mellitus without complication ([place])   . GI bleed not requiring more than 4 units of blood in 24 hours, ICU, or surgery Aug 15-17, 2015   2 units PRBCs  . Hypertension   . Hypertriglyceridemia   . Personal history of tobacco use, presenting hazards to health 06/18/2015  . Tobacco abuse   . Vertebral artery occlusion     Past Surgical History:  Procedure Laterality Date  . BREAST SURGERY Left 02/21/1988   Mastectomy  . CAROTID ENDARTERECTOMY Left July 2015  . CHOLECYSTECTOMY  02/20/1978    Social History Social  History  Substance Use Topics  . Smoking status: Current Every Day Smoker    Packs/day: 1.00    Years: 50.00    Types: Cigarettes  . Smokeless tobacco: Never Used  . Alcohol use No  Married  Family History Family History  Problem Relation Age of Onset  . Heart disease Mother        in her 20's  . Stroke Mother        54 yo  . Hypertension Mother   . Heart attack Mother   . Heart disease Father        53 yo  . Diabetes Father   . Asthma Father   . Heart attack Father   . Hypertension Father   . Cancer Neg Hx   . COPD Neg Hx     Allergies  Allergen Reactions  . Atorvastatin Other (See Comments)    myalgias     REVIEW OF SYSTEMS  (Negative unless checked)  Constitutional: [] Weight loss  [] Fever  [] Chills Cardiac: [] Chest pain   [] Chest pressure   [] Palpitations   [] Shortness of breath when laying flat   [] Shortness of breath at rest   [x] Shortness of breath with exertion. Vascular:  [] Pain in legs with walking   [] Pain in legs at rest   [] Pain in legs when laying flat   [] Claudication   [] Pain in feet when walking  [] Pain in feet at rest  [] Pain in feet when laying flat   [] History of DVT   [] Phlebitis   [] Swelling in legs   [] Varicose veins   [] Non-healing ulcers Pulmonary:   [] Uses home oxygen   [] Productive cough   [] Hemoptysis   [] Wheeze  [] COPD   [] Asthma Neurologic:  [x] Dizziness  [] Blackouts   [] Seizures   [] History of stroke   [] History of TIA  [] Aphasia   [] Temporary blindness   [] Dysphagia   [] Weakness or numbness in arms   [] Weakness or numbness in legs Musculoskeletal:  [] Arthritis   [] Joint swelling   [] Joint pain   [] Low back pain Hematologic:  [] Easy bruising  [] Easy bleeding   [] Hypercoagulable state   [] Anemic  [] Hepatitis Gastrointestinal:  [] Blood in stool   [] Vomiting blood  [] Gastroesophageal reflux/heartburn   [] Difficulty swallowing. Genitourinary:  [] Chronic kidney disease   [] Difficult urination  [] Frequent urination  [] Burning with urination   [] Blood in urine Skin:  [] Rashes   [] Ulcers   [] Wounds Psychological:  [] History of anxiety   []  History of major depression.  Physical Examination  Vitals:   09/01/16 1021  BP: 138/60  Pulse: (!) 59  Resp: 15  Weight: 159 lb (72.1 kg)  Height: 5\' 1"  (1.549 m)   Body mass index is 30.04 kg/m. Gen:  WD/WN, NAD Head: [place]/AT, No temporalis wasting. Ear/Nose/Throat: Hearing grossly intact, nares w/o erythema or drainage, trachea midline Eyes: Conjunctiva clear. Sclera non-icteric Neck: Supple.  No JVD. Bilateral bruits present Pulmonary:  Good air movement, equal and clear to auscultation bilaterally.  Cardiac: RRR, normal S1, S2 Vascular:  Vessel  Right Left  Radial Palpable Palpable                                    Musculoskeletal: M/S 5/5 throughout.  No deformity or atrophy.  Neurologic: CN 2-12 intact. Sensation grossly intact in extremities.  Symmetrical.  Speech is fluent. Motor exam as listed above. Psychiatric: Judgment intact, Mood & affect appropriate for pt's clinical situation. Dermatologic:  No rashes or ulcers noted.  No cellulitis or open wounds.      CBC Lab Results  Component Value Date   WBC 11.3 (H) 08/03/2016   HGB 13.1 08/03/2016   HCT 40.6 08/03/2016   MCV 86.6 08/03/2016   PLT 314 08/03/2016    BMET    Component Value Date/Time   NA 139 08/03/2016 1421   NA 140 03/12/2015 1433   NA 147 (H) 10/05/2013 0454   K 4.7 08/03/2016 1421   K 3.4 (L) 10/05/2013 0454   CL 102 08/03/2016 1421   CL 113 (H) 10/05/2013 0454   CO2 24 08/03/2016 1421   CO2 25 10/05/2013 0454   GLUCOSE 154 (H) 08/03/2016 1421   GLUCOSE 143 (H) 10/05/2013 0454   BUN 15 08/03/2016 1421   BUN 19 03/12/2015 1433   BUN 7 10/05/2013 0454   CREATININE 0.96 08/03/2016 1421   CALCIUM 9.7 08/03/2016 1421   CALCIUM 7.9 (L) 10/05/2013 0454   GFRNONAA 61 08/03/2016 1421   GFRAA 70 08/03/2016 1421   CrCl cannot be calculated (Patient's most recent lab result is older than the maximum 21 days allowed.).  COAG Lab Results  Component Value Date   INR 1.1 10/04/2013   INR 1.2 09/19/2013   INR 1.0 07/17/2013    Radiology No results found.    Assessment/Plan Hyperlipidemia with target LDL less than 70 lipid control important in reducing the progression of atherosclerotic disease. Continue statin therapy   Controlled type 2 diabetes mellitus with circulatory disorder, without long-term current use of insulin (HCC) blood glucose control important in reducing the progression of atherosclerotic disease. Also, involved in wound healing. On appropriate medications.   Essential hypertension, benign blood pressure  control important in reducing the progression of atherosclerotic disease. On appropriate oral medications.   Carotid stenosis Her carotid duplex today demonstrates a known right carotid artery occlusion with velocities that are actually improved in her left carotid endarterectomy site and would fall in the moderate range today. She overall is doing well. Continue Plavix and statin agent. Plan to recheck duplex in one year.    Leotis Pain, MD  09/01/2016 11:35 AM    This note was created with Dragon medical transcription system.  Any errors from dictation are purely unintentional

## 2016-09-01 NOTE — Assessment & Plan Note (Signed)
blood glucose control important in reducing the progression of atherosclerotic disease. Also, involved in wound healing. On appropriate medications.  

## 2016-09-01 NOTE — Assessment & Plan Note (Signed)
Her carotid duplex today demonstrates a known right carotid artery occlusion with velocities that are actually improved in her left carotid endarterectomy site and would fall in the moderate range today. She overall is doing well. Continue Plavix and statin agent. Plan to recheck duplex in one year.

## 2016-09-01 NOTE — Assessment & Plan Note (Signed)
blood pressure control important in reducing the progression of atherosclerotic disease. On appropriate oral medications.  

## 2016-09-08 ENCOUNTER — Other Ambulatory Visit: Payer: Self-pay | Admitting: Family Medicine

## 2016-09-11 NOTE — Telephone Encounter (Signed)
Reviewed Cr and K+ June 2018; Rx approved

## 2016-09-14 ENCOUNTER — Encounter: Payer: Self-pay | Admitting: Family Medicine

## 2016-09-14 ENCOUNTER — Ambulatory Visit (INDEPENDENT_AMBULATORY_CARE_PROVIDER_SITE_OTHER): Payer: Medicare Other | Admitting: Family Medicine

## 2016-09-14 DIAGNOSIS — I10 Essential (primary) hypertension: Secondary | ICD-10-CM

## 2016-09-14 DIAGNOSIS — Z66 Do not resuscitate: Secondary | ICD-10-CM

## 2016-09-14 DIAGNOSIS — Z720 Tobacco use: Secondary | ICD-10-CM | POA: Diagnosis not present

## 2016-09-14 DIAGNOSIS — I6523 Occlusion and stenosis of bilateral carotid arteries: Secondary | ICD-10-CM | POA: Diagnosis not present

## 2016-09-14 DIAGNOSIS — E785 Hyperlipidemia, unspecified: Secondary | ICD-10-CM | POA: Diagnosis not present

## 2016-09-14 DIAGNOSIS — Z853 Personal history of malignant neoplasm of breast: Secondary | ICD-10-CM | POA: Diagnosis not present

## 2016-09-14 DIAGNOSIS — Z0000 Encounter for general adult medical examination without abnormal findings: Secondary | ICD-10-CM | POA: Diagnosis not present

## 2016-09-14 DIAGNOSIS — E1151 Type 2 diabetes mellitus with diabetic peripheral angiopathy without gangrene: Secondary | ICD-10-CM

## 2016-09-14 HISTORY — DX: Do not resuscitate: Z66

## 2016-09-14 MED ORDER — METOPROLOL TARTRATE 50 MG PO TABS: 25.0000 mg | ORAL_TABLET | Freq: Two times a day (BID) | ORAL | 1 refills | Status: AC

## 2016-09-14 NOTE — Progress Notes (Signed)
Patient: Julia Cunningham, Female    DOB: 1945/10/07, 71 y.o.   MRN: [id number]  Visit Date: 09/14/2016  Today's Provider: Enid Derry, MD   Chief Complaint  Patient presents with  . Annual Exam    medicare wellness    Subjective:   Julia Cunningham is a 71 y.o. female who presents today for her Subsequent Annual Wellness Visit.  Caregiver input:  N/a USPSTF grade A and B recommendations  Dr. Lucky Cowboy stopped the aspirin completely; just on plavix now; he checked her [place] and said it was better than the last time; artery was curved, really careful and particular  Depression:  Depression screen [facility] 2/9 09/14/2016 08/03/2016 04/06/2016 09/27/2015 08/09/2015  Decreased Interest 0 0 0 0 0  Down, Depressed, Hopeless 0 0 0 0 0  PHQ - 2 Score 0 0 0 0 0   Hypertension: BP Readings from Last 3 Encounters:  09/14/16 124/62  09/01/16 138/60  08/03/16 (!) 146/78   Obesity: she thinks she could lose 5-10 pounds Wt Readings from Last 3 Encounters:  09/14/16 161 lb 6.4 oz (73.2 kg)  09/01/16 159 lb (72.1 kg)  08/03/16 159 lb (72.1 kg)   BMI Readings from Last 3 Encounters:  09/14/16 30.43 kg/m  09/01/16 30.04 kg/m  08/03/16 29.08 kg/m    Alcohol: no Tobacco use: same; does not want to talk about it HIV, hep B, hep C: not interested STD testing and prevention (chl/gon/syphilis): not interested Intimate partner violence: no abuse Breast cancer: she declines mammogram, why fix it if it's not broke Cervical cancer screening: declines, after 65 Osteoporosis: will think about that Fall prevention/vitamin D: don't put self in dangerous situations Lipids:  Lab Results  Component Value Date   CHOL 299 (H) 08/03/2016   CHOL 189 04/06/2016   CHOL 182 09/27/2015   Lab Results  Component Value Date   HDL 34 (L) 08/03/2016   HDL 33 (L) 04/06/2016   HDL 42 (L) 09/27/2015   Lab Results  Component Value Date   LDLCALC NOT CALC 08/03/2016   LDLCALC 79 04/06/2016   LDLCALC 79 09/27/2015   Lab  Results  Component Value Date   TRIG 426 (H) 08/03/2016   TRIG 385 (H) 04/06/2016   TRIG 306 (H) 09/27/2015   Lab Results  Component Value Date   CHOLHDL 8.8 (H) 08/03/2016   CHOLHDL 5.7 (H) 04/06/2016   CHOLHDL 4.3 09/27/2015   No results found for: LDLDIRECT Glucose:  Glucose  Date Value Ref Range Status  10/05/2013 143 (H) 65 - 99 mg/dL Final  10/04/2013 132 (H) 65 - 99 mg/dL Final  10/02/2013 242 (H) 65 - 99 mg/dL Final   Glucose, Bld  Date Value Ref Range Status  08/03/2016 154 (H) 65 - 99 mg/dL Final  04/06/2016 150 (H) 65 - 99 mg/dL Final  09/27/2015 124 (H) 65 - 99 mg/dL Final   Colorectal cancer: decline; patient not interested in cologuard, says she is not eligible per Dr. Percell [place] staff Lung cancer:  Offered chest CT, due in Sept, they call her from the cancer center AAA: seeing vascular surgeon Aspirin: no, just plavix Diet: sometimes splurges, but most of the time, good eater Exercise: not much walking; does exercises in the house, twists and leg lifts and home exercise Skin cancer: nothing worrisome  6CIT Screen 09/14/2016  What Year? 0 points  What month? 0 points  What time? 0 points  Count back from 20 0 points  Months in reverse 0 points  Repeat phrase  0 points  Total Score 0    HPI  Review of Systems  Cardiovascular: Negative for chest pain.  Gastrointestinal: Negative for blood in stool.    Past Medical History:  Diagnosis Date  . Aortic atherosclerosis ([place])   . Atherosclerosis of right carotid artery   . Breast cancer ([place])    Breast left   . Collagen vascular disease ([place])   . Controlled type 2 diabetes mellitus with circulatory disorder, without long-term current use of insulin ([place])   . Diabetes mellitus without complication ([place])   . DNAR (do not attempt resuscitation) 09/14/2016   Discussed with patient, DNR confirmed September 14, 2016  . GI bleed not requiring more than 4 units of blood in 24 hours, ICU, or surgery Aug 15-17, 2015    2 units PRBCs  . Hypertension   . Hypertriglyceridemia   . Personal history of tobacco use, presenting hazards to health 06/18/2015  . Tobacco abuse   . Vertebral artery occlusion     Past Surgical History:  Procedure Laterality Date  . BREAST SURGERY Left 02/21/1988   Mastectomy  . CAROTID ENDARTERECTOMY Left July 2015  . CHOLECYSTECTOMY  02/20/1978    Family History  Problem Relation Age of Onset  . Heart disease Mother        in her 63's  . Stroke Mother        22 yo  . Hypertension Mother   . Heart attack Mother   . Heart disease Father        53 yo  . Diabetes Father   . Asthma Father   . Heart attack Father   . Hypertension Father   . Heart disease Brother   . Hypertension Daughter   . Anuerysm Maternal Grandmother   . Ulcers Maternal Grandfather   . Heart disease Paternal Grandmother   . Emphysema Paternal Grandfather   . Cancer Neg Hx   . COPD Neg Hx   Father had massive heart attack at age 96  Social History   Social History  . Marital status: Married    Spouse name: N/A  . Number of children: N/A  . Years of education: N/A   Occupational History  . Not on file.   Social History Main Topics  . Smoking status: Current Every Day Smoker    Packs/day: 1.00    Years: 50.00    Types: Cigarettes  . Smokeless tobacco: Never Used  . Alcohol use No  . Drug use: No  . Sexual activity: Yes   Other Topics Concern  . Not on file   Social History Narrative  . No narrative on file    Outpatient Encounter Prescriptions as of 09/14/2016  Medication Sig  . amLODipine (NORVASC) 5 MG tablet TAKE 1 TABLET (5 MG TOTAL) BY MOUTH DAILY. FOR BLOOD PRESSURE  . clopidogrel (PLAVIX) 75 MG tablet TAKE 1 TABLET EVERY DAY  . Garlic Oil 355 MG TABS Take 500 mg by mouth daily.  Marland Kitchen glimepiride (AMARYL) 4 MG tablet Take 1 tablet (4 mg total) by mouth daily before breakfast.  . glucose blood (TRUETEST TEST) test strip Check fingerstick sugars once a day on average; Dx E11.9;  LON 99 months  . Krill Oil 500 MG CAPS Take 1 capsule (500 mg total) by mouth 2 (two) times daily.  . Lancets 30G MISC Reli-On brand requested by patient; check fingerstick sugars once a day on average; Dx E11.9  . losartan (COZAAR) 100 MG tablet TAKE 1 TABLET (100 MG  TOTAL) BY MOUTH DAILY.  . meclizine (ANTIVERT) 25 MG tablet   . metoprolol tartrate (LOPRESSOR) 50 MG tablet Take 0.5 tablets (25 mg total) by mouth 2 (two) times daily.  . mometasone (ELOCON) 0.1 % lotion   . pantoprazole (PROTONIX) 40 MG tablet Take 1 tablet (40 mg total) by mouth daily.  . rosuvastatin (CRESTOR) 10 MG tablet Take 1 tablet (10 mg total) by mouth at bedtime. For cholesterol  . triamcinolone cream (KENALOG) 0.1 % Apply 1 application topically daily as needed (external ears).  . [DISCONTINUED] metoprolol tartrate (LOPRESSOR) 50 MG tablet Take 1 tablet (50 mg total) by mouth 2 (two) times daily. (Patient taking differently: Take 50 mg by mouth daily. )  . [DISCONTINUED] aspirin EC 325 MG tablet Take 1 tablet (325 mg total) by mouth daily. (Patient not taking: Reported on 09/01/2016)   No facility-administered encounter medications on file as of 09/14/2016.     Functional Ability / Safety Screening 1.  Was the timed Get Up and Go test longer than 30 seconds?  no 2.  Does the patient need help with the phone, transportation, shopping,      preparing meals, housework, laundry, medications, or managing money?  no 3.  Does the patient's home have:  loose throw rugs in the hallway?   no      Grab bars in the bathroom? no      Handrails on the stairs?   no stairs      Poor lighting?   no 4.  Has the patient noticed any hearing difficulties?   no  Fall Risk Assessment See under rooming  Depression Screen See under rooming Depression screen [facility] 2/9 09/14/2016 08/03/2016 04/06/2016 09/27/2015 08/09/2015  Decreased Interest 0 0 0 0 0  Down, Depressed, Hopeless 0 0 0 0 0  PHQ - 2 Score 0 0 0 0 0    Advanced  Directives Does patient have a HCPOA?    no If yes, name and contact information: husband is okay, home  Does patient have a living will or MOST form?  no  Does not want resuscitative efforts if collapses or stops breathing  Objective:   Vitals: BP 124/62   Pulse 76   Temp 97.9 F (36.6 C) (Oral)   Resp 16   Ht 5' 1.06" (1.551 m)   Wt 161 lb 6.4 oz (73.2 kg)   SpO2 99%   BMI 30.43 kg/m  Body mass index is 30.43 kg/m. No exam data present  Physical Exam  Constitutional: She appears well-developed and well-nourished.  Cardiovascular: Normal rate.   Pulmonary/Chest: Effort normal.  Neurological: She is alert.   Diabetic Foot Form - Detailed   Diabetic Foot Exam - detailed Diabetic Foot exam was performed with the following findings:  Yes 09/14/2016 11:49 AM  Visual Foot Exam completed.:  Yes  Pulse Foot Exam completed.:  Yes  Right Dorsalis Pedis:  Present, Diminished Left Dorsalis Pedis:  Present, Diminished  Sensory Foot Exam Completed.:  Yes Semmes-Weinstein Monofilament Test R Site 1-Great Toe:  Pos L Site 1-Great Toe:  Pos        Mood/affect:  euthymic Appearance:  Casually dressed  6CIT Screen 09/14/2016  What Year? 0 points  What month? 0 points  What time? 0 points  Count back from 20 0 points  Months in reverse 0 points  Repeat phrase 0 points  Total Score 0    Assessment & Plan:     Annual Wellness Visit  Reviewed patient's Family  Medical History Reviewed and updated list of patient's medical providers Assessment of cognitive impairment was done Assessed patient's functional ability Established a written schedule for health screening [place] Completed and Reviewed  Exercise Activities and Dietary recommendations Goals    None    eat a little better and lose a little weight  There is no immunization history for the selected administration types on file for this patient.  Health Maintenance  Topic Date Due  .  MAMMOGRAM  09/14/2016 (Originally 05/13/1963)  . DEXA SCAN  09/14/2016 (Originally [birth year])  . COLONOSCOPY  09/14/2016 (Originally 05/13/1997)  . TETANUS/TDAP  09/14/2016 (Originally 05/14/1966)  . PNA vac Low Risk Adult (1 of 2 - PCV13) 09/14/2016 (Originally 05/13/2012)  . INFLUENZA VACCINE  09/20/2016  . OPHTHALMOLOGY EXAM  12/21/2016  . HEMOGLOBIN A1C  02/02/2017  . FOOT EXAM  04/06/2017  . Hepatitis C Screening  Completed    Discussed health benefits of physical activity, and encouraged her to engage in regular exercise appropriate for her age and condition.   Problem List Items Addressed This Visit      Cardiovascular and Mediastinum   Essential hypertension, benign (Chronic)    Explained that taking short-acting metoprolol once a day was not ideal, and the single pill she is taking won't last long enough; advised her to take 1/2 pill BID if she wants the lower dose      Relevant Medications   metoprolol tartrate (LOPRESSOR) 50 MG tablet   Controlled type 2 diabetes mellitus with circulatory disorder, without long-term current use of insulin (HCC) (Chronic)    Foot exam by MD today      Relevant Medications   metoprolol tartrate (LOPRESSOR) 50 MG tablet     Other   Tobacco abuse (Chronic)    Patient did not want to talk about it, not ready to quit      Preventative health care    USPSTF grade A and B recommendations reviewed with patient; age-appropriate recommendations, preventive care, screening tests, etc discussed and encouraged; healthy living encouraged; see AVS for patient education given to patient Patient actually declines nearly all preventive care services; explained the rationale behind testing (screening) when there isn't a known problem yet, as sooner detection may lead to better outcomes and even cures; small fire in the trash can analogy used, which she related to, but she still declines all but the chest CT screening      Hyperlipidemia with target LDL  less than 70    Due for recheck lipid panel soon      Relevant Medications   metoprolol tartrate (LOPRESSOR) 50 MG tablet   Hx of breast cancer    Mastectomy bra prescribed; patient declines mammograms      DNAR (do not attempt resuscitation)    DNR confirmed with patient; always changeable, discuss with family          Meds ordered this encounter  Medications  . metoprolol tartrate (LOPRESSOR) 50 MG tablet    Sig: Take 0.5 tablets (25 mg total) by mouth 2 (two) times daily.    Dispense:  90 tablet    Refill:  1    New instructions    Current Outpatient Prescriptions:  .  amLODipine (NORVASC) 5 MG tablet, TAKE 1 TABLET (5 MG TOTAL) BY MOUTH DAILY. FOR BLOOD PRESSURE, Disp: 90 tablet, Rfl: 3 .  clopidogrel (PLAVIX) 75 MG tablet, TAKE 1 TABLET EVERY DAY, Disp: 90 tablet, Rfl: 1 .  Garlic Oil 099 MG  TABS, Take 500 mg by mouth daily., Disp: , Rfl:  .  glimepiride (AMARYL) 4 MG tablet, Take 1 tablet (4 mg total) by mouth daily before breakfast., Disp: 90 tablet, Rfl: 3 .  glucose blood (TRUETEST TEST) test strip, Check fingerstick sugars once a day on average; Dx E11.9; LON 99 months, Disp: 100 each, Rfl: 1 .  Krill Oil 500 MG CAPS, Take 1 capsule (500 mg total) by mouth 2 (two) times daily., Disp: , Rfl:  .  Lancets 30G MISC, Reli-On brand requested by patient; check fingerstick sugars once a day on average; Dx E11.9, Disp: 100 each, Rfl: 1 .  losartan (COZAAR) 100 MG tablet, TAKE 1 TABLET (100 MG TOTAL) BY MOUTH DAILY., Disp: 90 tablet, Rfl: 1 .  meclizine (ANTIVERT) 25 MG tablet, , Disp: , Rfl:  .  metoprolol tartrate (LOPRESSOR) 50 MG tablet, Take 0.5 tablets (25 mg total) by mouth 2 (two) times daily., Disp: 90 tablet, Rfl: 1 .  mometasone (ELOCON) 0.1 % lotion, , Disp: , Rfl:  .  pantoprazole (PROTONIX) 40 MG tablet, Take 1 tablet (40 mg total) by mouth daily., Disp: 90 tablet, Rfl: 1 .  rosuvastatin (CRESTOR) 10 MG tablet, Take 1 tablet (10 mg total) by mouth at bedtime. For  cholesterol, Disp: 90 tablet, Rfl: 0 .  triamcinolone cream (KENALOG) 0.1 %, Apply 1 application topically daily as needed (external ears)., Disp: , Rfl:  Medications Discontinued During This Encounter  Medication Reason  . metoprolol tartrate (LOPRESSOR) 50 MG tablet Reorder  . aspirin EC 325 MG tablet Discontinued by provider    Next Medicare Wellness Visit in 12+ months

## 2016-09-14 NOTE — Patient Instructions (Addendum)
Think about these vaccines and let us know if you'd like them, or you can get them at your pharmacy: Shingrix (for shingles) Prevnar (PCV-13 for pneumonia) Pneumovax (PPSV-23 for pneumonia) Flu shot  Consider getting a bone density test (DEXA) to check for thin bones which might put you at risk for a hip fracture or back fracture Vitamin D3 one a day of the 1,000 iu strength  Please do have your chest CT done in September  Please let me know if you are interested in any of these screening tests Health Maintenance  Topic Date Due  . MAMMOGRAM  09/14/2016 (Originally 05/13/1963)  . DEXA SCAN  09/14/2016 (Originally 21-Sep-1945)  . COLONOSCOPY  09/14/2016 (Originally 05/13/1997)  . TETANUS/TDAP  09/14/2016 (Originally [birth year]14[birth year]1966)  . PNA vac Low Risk Adult (1 of 2 - PCV13) 09/14/2016 (Originally 05/13/2012)  . INFLUENZA VACCINE  09/20/2016  . OPHTHALMOLOGY EXAM  12/21/2016  . HEMOGLOBIN A1C  02/02/2017  . FOOT EXAM  04/06/2017  . Hepatitis C Screening  Completed   Please have fasting labs done for your cholesterol on or after July 31st  Heart-Healthy Eating Plan Heart-healthy meal planning includes:  Limiting unhealthy fats.  Increasing healthy fats.  Making other small dietary changes.  You may need to talk with your doctor or a diet specialist (dietitian) to create an eating plan that is right for you. What types of fat should I choose?  Choose healthy fats. These include olive oil and canola oil, flaxseeds, walnuts, almonds, and seeds.  Eat more omega-3 fats. These include salmon, mackerel, sardines, tuna, flaxseed oil, and ground flaxseeds. Try to eat fish at least twice each week.  Limit saturated fats. ? Saturated fats are often found in animal products, such as meats, butter, and cream. ? Plant sources of saturated fats include palm oil, palm kernel oil, and coconut oil.  Avoid foods with partially hydrogenated oils in them. These include stick margarine, some tub  margarines, cookies, crackers, and other baked goods. These contain trans fats. What general guidelines do I need to follow?  Check food labels carefully. Identify foods with trans fats or high amounts of saturated fat.  Fill one half of your plate with vegetables and green salads. Eat 4-5 servings of vegetables per day. A serving of vegetables is: ? 1 cup of raw leafy vegetables. ?  cup of raw or cooked cut-up vegetables. ?  cup of vegetable juice.  Fill one fourth of your plate with whole grains. Look for the word "whole" as the first word in the ingredient list.  Fill one fourth of your plate with lean protein foods.  Eat 4-5 servings of fruit per day. A serving of fruit is: ? One medium whole fruit. ?  cup of dried fruit. ?  cup of fresh, frozen, or canned fruit. ?  cup of 100% fruit juice.  Eat more foods that contain soluble fiber. These include apples, broccoli, carrots, beans, peas, and barley. Try to get 20-30 g of fiber per day.  Eat more home-cooked food. Eat less restaurant, buffet, and fast food.  Limit or avoid alcohol.  Limit foods high in starch and sugar.  Avoid fried foods.  Avoid frying your food. Try baking, boiling, grilling, or broiling it instead. You can also reduce fat by: ? Removing the skin from poultry. ? Removing all visible fats from meats. ? Skimming the fat off of stews, soups, and gravies before serving them. ? Steaming vegetables in water or broth.  Lose weight if  you are overweight.  Eat 4-5 servings of nuts, legumes, and seeds per week: ? One serving of dried beans or legumes equals  cup after being cooked. ? One serving of nuts equals 1 ounces. ? One serving of seeds equals  ounce or one tablespoon.  You may need to keep track of how much salt or sodium you eat. This is especially true if you have high blood pressure. Talk with your doctor or dietitian to get more information. What foods can I eat? Grains Breads, including  [place], white, pita, wheat, raisin, rye, oatmeal, and [place]. Tortillas that are neither fried nor made with lard or trans fat. Low-fat rolls, including hotdog and hamburger buns and English muffins. Biscuits. Muffins. Waffles. Pancakes. Light popcorn. Whole-grain cereals. Flatbread. Melba toast. Pretzels. Breadsticks. Rusks. Low-fat snacks. Low-fat crackers, including oyster, saltine, matzo, graham, animal, and rye. Rice and pasta, including brown rice and pastas that are made with whole wheat. Vegetables All vegetables. Fruits All fruits, but limit coconut. Meats and Other Protein Sources Lean, well-trimmed beef, veal, pork, and lamb. Chicken and [place] without skin. All fish and shellfish. Wild duck, rabbit, pheasant, and venison. Egg whites or low-cholesterol egg substitutes. Dried beans, peas, lentils, and tofu. Seeds and most nuts. Dairy Low-fat or nonfat cheeses, including ricotta, string, and mozzarella. Skim or 1% milk that is liquid, powdered, or evaporated. Buttermilk that is made with low-fat milk. Nonfat or low-fat yogurt. Beverages Mineral water. Diet carbonated beverages. Sweets and Desserts Sherbets and fruit ices. Honey, jam, marmalade, jelly, and syrups. Meringues and gelatins. Pure sugar candy, such as hard candy, jelly beans, gumdrops, mints, marshmallows, and small amounts of dark chocolate. [employer] Eat all sweets and desserts in moderation. Fats and Oils Nonhydrogenated (trans-free) margarines. Vegetable oils, including soybean, sesame, sunflower, olive, peanut, safflower, corn, canola, and cottonseed. Salad dressings or mayonnaise made with a vegetable oil. Limit added fats and oils that you use for cooking, baking, salads, and as spreads. Other Cocoa powder. Coffee and tea. All seasonings and condiments. The items listed above may not be a complete list of recommended foods or beverages. Contact your dietitian for more options. What foods are not  recommended? Grains Breads that are made with saturated or trans fats, oils, or whole milk. Croissants. Butter rolls. Cheese breads. Sweet rolls. Donuts. Buttered popcorn. Chow mein noodles. High-fat crackers, such as cheese or butter crackers. Meats and Other Protein Sources Fatty meats, such as hotdogs, short ribs, sausage, spareribs, bacon, rib eye roast or steak, and mutton. High-fat deli meats, such as salami and bologna. Caviar. Domestic duck and goose. Organ meats, such as kidney, liver, sweetbreads, and heart. Dairy Cream, sour cream, cream cheese, and creamed cottage cheese. Whole-milk cheeses, including blue (bleu), Monte[place] Jack, [place], [place], [place], [place], Swiss, cheddar, [place], and [place]. Whole or 2% milk that is liquid, evaporated, or condensed. Whole buttermilk. Cream sauce or high-fat cheese sauce. Yogurt that is made from whole milk. Beverages Regular sodas and juice drinks with added sugar. Sweets and Desserts Frosting. Pudding. Cookies. Cakes other than angel food cake. Candy that has milk chocolate or white chocolate, hydrogenated fat, butter, coconut, or unknown ingredients. Buttered syrups. Full-fat ice cream or ice cream drinks. Fats and Oils Gravy that has suet, meat fat, or shortening. Cocoa butter, hydrogenated oils, palm oil, coconut oil, palm kernel oil. These can often be found in baked products, candy, fried foods, nondairy creamers, and whipped toppings. Solid fats and shortenings, including bacon fat, salt pork, lard, and butter.  Nondairy cream substitutes, such as coffee creamers and sour cream substitutes. Salad dressings that are made of unknown oils, cheese, or sour cream. The items listed above may not be a complete list of foods and beverages to avoid. Contact your dietitian for more information. This information is not intended to replace advice given to you by your health care provider. Make sure you discuss any questions you have with your health care  provider. Document Released: 08/08/2011 Document Revised: 07/15/2015 Document Reviewed: 07/31/2013 Elsevier Interactive Patient Education  2018 [place] Maintenance for Postmenopausal Women Menopause is a normal process in which your reproductive ability comes to an end. This process happens gradually over a span of months to years, usually between the ages of 50 and 57. Menopause is complete when you have missed 12 consecutive menstrual periods. It is important to talk with your health care provider about some of the most common conditions that affect postmenopausal women, such as heart disease, cancer, and bone loss (osteoporosis). Adopting a healthy lifestyle and getting preventive care can help to promote your health and wellness. Those actions can also lower your chances of developing some of these common conditions. What should I know about menopause? During menopause, you may experience a number of symptoms, such as:  Moderate-to-severe hot flashes.  Night sweats.  Decrease in sex drive.  Mood swings.  Headaches.  Tiredness.  Irritability.  Memory problems.  Insomnia.  Choosing to treat or not to treat menopausal changes is an individual decision that you make with your health care provider. What should I know about hormone replacement therapy and supplements? Hormone therapy products are effective for treating symptoms that are associated with menopause, such as hot flashes and night sweats. Hormone replacement carries certain risks, especially as you become older. If you are thinking about using estrogen or estrogen with progestin treatments, discuss the benefits and risks with your health care provider. What should I know about heart disease and stroke? Heart disease, heart attack, and stroke become more likely as you age. This may be due, in part, to the hormonal changes that your body experiences during menopause. These can affect how your body processes  dietary fats, triglycerides, and cholesterol. Heart attack and stroke are both medical emergencies. There are many things that you can do to help prevent heart disease and stroke:  Have your blood pressure checked at least every 1-2 years. High blood pressure causes heart disease and increases the risk of stroke.  If you are 25-27 years old, ask your health care provider if you should take aspirin to prevent a heart attack or a stroke.  Do not use any tobacco products, including cigarettes, chewing tobacco, or electronic cigarettes. If you need help quitting, ask your health care provider.  It is important to eat a healthy diet and maintain a healthy weight. ? Be sure to include plenty of vegetables, fruits, low-fat dairy products, and lean protein. ? Avoid eating foods that are high in solid fats, added sugars, or salt (sodium).  Get regular exercise. This is one of the most important things that you can do for your health. ? Try to exercise for at least 150 minutes each week. The type of exercise that you do should increase your heart rate and make you sweat. This is known as moderate-intensity exercise. ? Try to do strengthening exercises at least twice each week. Do these in addition to the moderate-intensity exercise.  Know your numbers.Ask your health care provider to check your  cholesterol and your blood glucose. Continue to have your blood tested as directed by your health care provider.  What should I know about cancer screening? There are several types of cancer. Take the following steps to reduce your risk and to catch any cancer development as early as possible. Breast Cancer  Practice breast self-awareness. ? This means understanding how your breasts normally appear and feel. ? It also means doing regular breast self-exams. Let your health care provider know about any changes, no matter how small.  If you are 30 or older, have a clinician do a breast exam (clinical breast  exam or CBE) every year. Depending on your age, family history, and medical history, it may be recommended that you also have a yearly breast X-ray (mammogram).  If you have a family history of breast cancer, talk with your health care provider about genetic screening.  If you are at high risk for breast cancer, talk with your health care provider about having an MRI and a mammogram every year.  Breast cancer (BRCA) gene test is recommended for women who have family members with BRCA-related cancers. Results of the assessment will determine the need for genetic counseling and BRCA1 and for BRCA2 testing. BRCA-related cancers include these types: ? Breast. This occurs in males or females. ? Ovarian. ? Tubal. This may also be called fallopian tube cancer. ? Cancer of the abdominal or pelvic lining (peritoneal cancer). ? Prostate. ? Pancreatic.  Cervical, Uterine, and Ovarian Cancer Your health care provider may recommend that you be screened regularly for cancer of the pelvic organs. These include your ovaries, uterus, and vagina. This screening involves a pelvic exam, which includes checking for microscopic changes to the surface of your cervix (Pap test).  For women ages 21-65, health care providers may recommend a pelvic exam and a Pap test every three years. For women ages 29-65, they may recommend the Pap test and pelvic exam, combined with testing for human papilloma virus (HPV), every five years. Some types of HPV increase your risk of cervical cancer. Testing for HPV may also be done on women of any age who have unclear Pap test results.  Other health care providers may not recommend any screening for nonpregnant women who are considered low risk for pelvic cancer and have no symptoms. Ask your health care provider if a screening pelvic exam is right for you.  If you have had past treatment for cervical cancer or a condition that could lead to cancer, you need Pap tests and screening for  cancer for at least 20 years after your treatment. If Pap tests have been discontinued for you, your risk factors (such as having a new sexual partner) need to be reassessed to determine if you should start having screenings again. Some women have medical problems that increase the chance of getting cervical cancer. In these cases, your health care provider may recommend that you have screening and Pap tests more often.  If you have a family history of uterine cancer or ovarian cancer, talk with your health care provider about genetic screening.  If you have vaginal bleeding after reaching menopause, tell your health care provider.  There are currently no reliable tests available to screen for ovarian cancer.  Lung Cancer Lung cancer screening is recommended for adults 70-64 years old who are at high risk for lung cancer because of a history of smoking. A yearly low-dose CT scan of the lungs is recommended if you:  Currently smoke.  Have  a history of at least 30 pack-years of smoking and you currently smoke or have quit within the past 15 years. A pack-year is smoking an average of one pack of cigarettes per day for one year.  Yearly screening should:  Continue until it has been 15 years since you quit.  Stop if you develop a health problem that would prevent you from having lung cancer treatment.  Colorectal Cancer  This type of cancer can be detected and can often be prevented.  Routine colorectal cancer screening usually begins at age 74 and continues through age 69.  If you have risk factors for colon cancer, your health care provider may recommend that you be screened at an earlier age.  If you have a family history of colorectal cancer, talk with your health care provider about genetic screening.  Your health care provider may also recommend using home test kits to check for hidden blood in your stool.  A small camera at the end of a tube can be used to examine your colon  directly (sigmoidoscopy or colonoscopy). This is done to check for the earliest forms of colorectal cancer.  Direct examination of the colon should be repeated every 5-10 years until age 91. However, if early forms of precancerous polyps or small growths are found or if you have a family history or genetic risk for colorectal cancer, you may need to be screened more often.  Skin Cancer  Check your skin from head to toe regularly.  Monitor any moles. Be sure to tell your health care provider: ? About any new moles or changes in moles, especially if there is a change in a mole's shape or color. ? If you have a mole that is larger than the size of a pencil eraser.  If any of your family members has a history of skin cancer, especially at a young age, talk with your health care provider about genetic screening.  Always use sunscreen. Apply sunscreen liberally and repeatedly throughout the day.  Whenever you are outside, protect yourself by wearing long sleeves, pants, a wide-brimmed hat, and sunglasses.  What should I know about osteoporosis? Osteoporosis is a condition in which bone destruction happens more quickly than new bone creation. After menopause, you may be at an increased risk for osteoporosis. To help prevent osteoporosis or the bone fractures that can happen because of osteoporosis, the following is recommended:  If you are 39-79 years old, get at least 1,000 mg of calcium and at least 600 mg of vitamin D per day.  If you are older than age 53 but younger than age 2, get at least 1,200 mg of calcium and at least 600 mg of vitamin D per day.  If you are older than age 86, get at least 1,200 mg of calcium and at least 800 mg of vitamin D per day.  Smoking and excessive alcohol intake increase the risk of osteoporosis. Eat foods that are rich in calcium and vitamin D, and do weight-bearing exercises several times each week as directed by your health care provider. What should I  know about how menopause affects my mental health? Depression may occur at any age, but it is more common as you become older. Common symptoms of depression include:  Low or sad mood.  Changes in sleep patterns.  Changes in appetite or eating patterns.  Feeling an overall lack of motivation or enjoyment of activities that you previously enjoyed.  Frequent crying spells.  Talk with your health  care provider if you think that you are experiencing depression. What should I know about immunizations? It is important that you get and maintain your immunizations. These include:  Tetanus, diphtheria, and pertussis (Tdap) booster vaccine.  Influenza every year before the flu season begins.  Pneumonia vaccine.  Shingles vaccine.  Your health care provider may also recommend other immunizations. This information is not intended to replace advice given to you by your health care provider. Make sure you discuss any questions you have with your health care provider. Document Released: 03/31/2005 Document Revised: 08/27/2015 Document Reviewed: 11/10/2014 Elsevier Interactive Patient Education  2018 [employer].

## 2016-09-14 NOTE — Assessment & Plan Note (Addendum)
Mastectomy bra prescribed; patient declines mammograms

## 2016-09-14 NOTE — Assessment & Plan Note (Signed)
Patient did not want to talk about it, not ready to quit

## 2016-09-14 NOTE — Assessment & Plan Note (Signed)
Explained that taking short-acting metoprolol once a day was not ideal, and the single pill she is taking won't last long enough; advised her to take 1/2 pill BID if she wants the lower dose

## 2016-09-14 NOTE — Assessment & Plan Note (Signed)
Due for recheck lipid panel soon

## 2016-09-14 NOTE — Assessment & Plan Note (Signed)
Foot exam by MD today 

## 2016-09-14 NOTE — Assessment & Plan Note (Signed)
USPSTF grade A and B recommendations reviewed with patient; age-appropriate recommendations, preventive care, screening tests, etc discussed and encouraged; healthy living encouraged; see AVS for patient education given to patient Patient actually declines nearly all preventive care services; explained the rationale behind testing (screening) when there isn't a known problem yet, as sooner detection may lead to better outcomes and even cures; small fire in the trash can analogy used, which she related to, but she still declines all but the chest CT screening

## 2016-09-14 NOTE — Assessment & Plan Note (Signed)
DNR confirmed with patient; always changeable, discuss with family

## 2016-10-17 ENCOUNTER — Telehealth: Payer: Self-pay | Admitting: *Deleted

## 2016-10-17 DIAGNOSIS — Z87891 Personal history of nicotine dependence: Secondary | ICD-10-CM

## 2016-10-17 DIAGNOSIS — Z122 Encounter for screening for malignant neoplasm of respiratory organs: Secondary | ICD-10-CM

## 2016-10-17 NOTE — Telephone Encounter (Signed)
Notified patient that annual lung cancer screening low dose CT scan is due currently or will be in near future. Confirmed that patient is within the age range of 55-77, and asymptomatic, (no signs or symptoms of lung cancer). Patient denies illness that would prevent curative treatment for lung cancer if found. Verified smoking history, (current, 42 pack year). The shared decision making visit was done 05/08/14. Patient is agreeable for CT scan being scheduled.

## 2016-10-31 ENCOUNTER — Ambulatory Visit: Admission: RE | Admit: 2016-10-31 | Payer: Medicare Other | Source: Ambulatory Visit

## 2016-11-15 ENCOUNTER — Telehealth: Payer: Self-pay | Admitting: *Deleted

## 2016-11-15 NOTE — Telephone Encounter (Signed)
Left message for patient to notify them that it is time to schedule annual low dose lung cancer screening CT scan. Instructed patient to call back to verify information prior to the scan being scheduled. Patient had prior no show appt.

## 2016-11-20 ENCOUNTER — Encounter: Payer: Self-pay | Admitting: *Deleted

## 2017-01-15 ENCOUNTER — Other Ambulatory Visit: Payer: Self-pay | Admitting: Family Medicine

## 2017-01-15 ENCOUNTER — Other Ambulatory Visit (INDEPENDENT_AMBULATORY_CARE_PROVIDER_SITE_OTHER): Payer: Self-pay | Admitting: Vascular Surgery

## 2017-01-17 ENCOUNTER — Telehealth: Payer: Self-pay | Admitting: Family Medicine

## 2017-01-17 NOTE — Telephone Encounter (Signed)
LVM to call the office and schedule an appt

## 2017-01-17 NOTE — Telephone Encounter (Signed)
Patient will be due for her diabetes follow-up visit on or just after 02/02/2017 I don't see her on the schedule Please ask her to come fasting for labs that day and we look forward to seeing her soon

## 2017-01-22 NOTE — Telephone Encounter (Signed)
May need to send a letter

## 2017-01-22 NOTE — Telephone Encounter (Signed)
Called pt informed her of the need for diabetes f/u appt, pt states that December will not work for her as she goes away for the holidays. Pt states that she would like an appt for January. Scheduled pt for 03/05/17.

## 2017-02-14 IMAGING — CT CT ABD-PELV W/ CM
1 of 3 series · 12 of 32 positions shown, 17 images · IV contrast (omnipaque)
Comparison: None.

CLINICAL DATA: Two day history of generalized abdominal pain and
bilateral flank pain and low back pain. Surgical history includes
cholecystectomy. Personal history of left breast cancer post
mastectomy [REDACTED].

EXAM:
CT ABDOMEN AND PELVIS WITH CONTRAST
TECHNIQUE: Multidetector CT imaging of the abdomen and pelvis was performed
using the standard protocol following bolus administration of
intravenous contrast.
CONTRAST:  100mL OMNIPAQUE IOHEXOL 300 MG/ML IV. Oral contrast was
also administered.

[Series 2: routine abd pel with · axial · 0.68mm/px · z∈[-380,-5]mm · 12 of 85 slices shown, 17 images]
[im 5/85  soft-tissue]
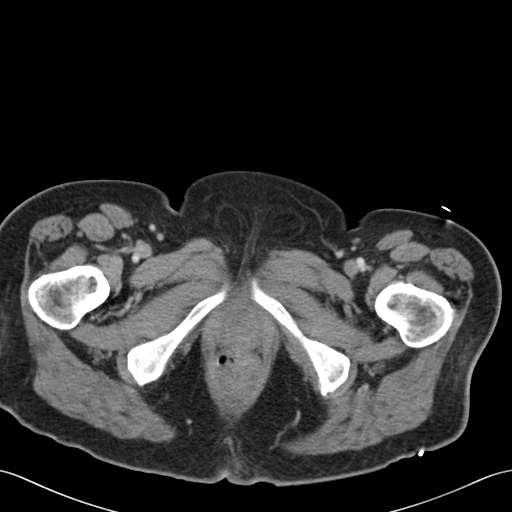
[im 5/85  bone]
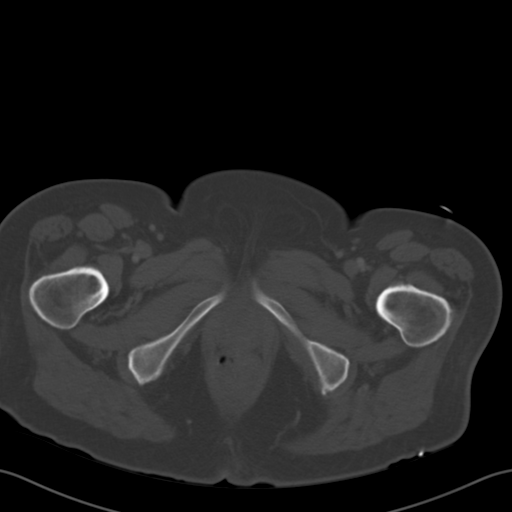
[im 15/85  soft-tissue]
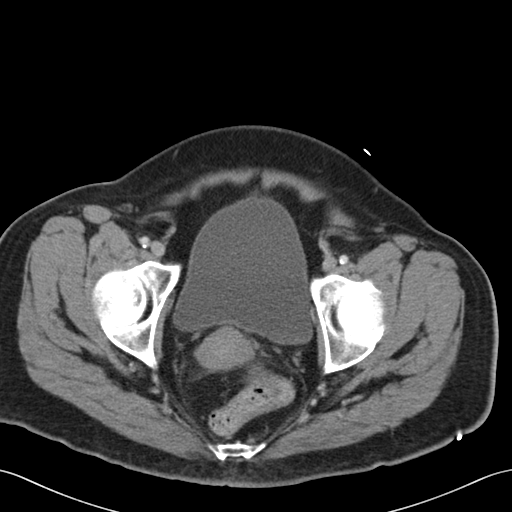
[im 19/85  soft-tissue]
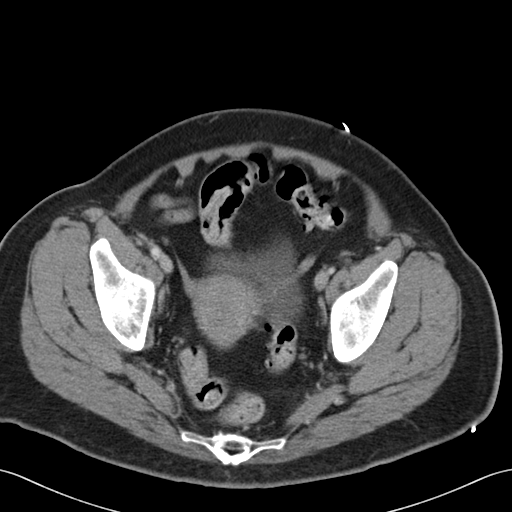
[im 29/85  soft-tissue]
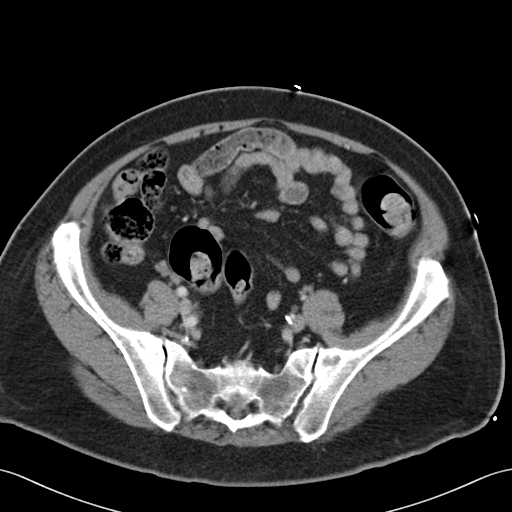
[im 33/85  soft-tissue]
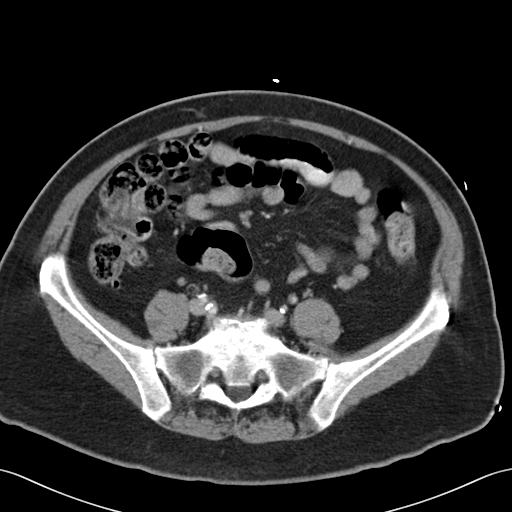
[im 43/85  soft-tissue]
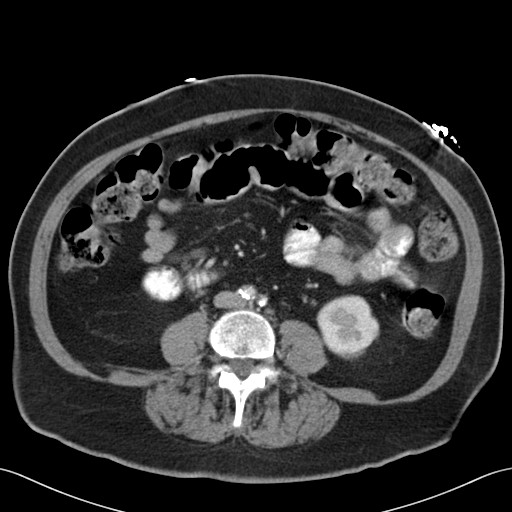
[im 52/85  soft-tissue]
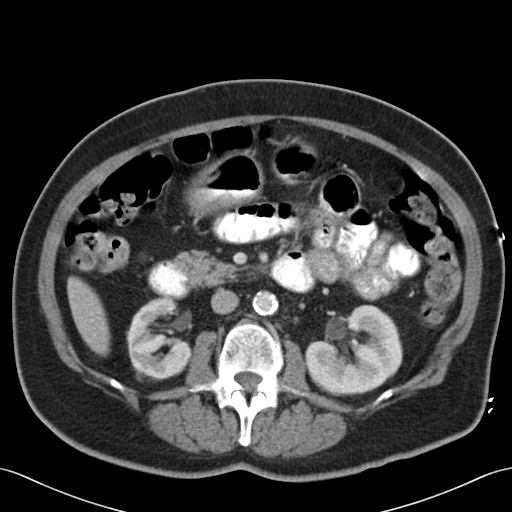
[im 57/85  soft-tissue]
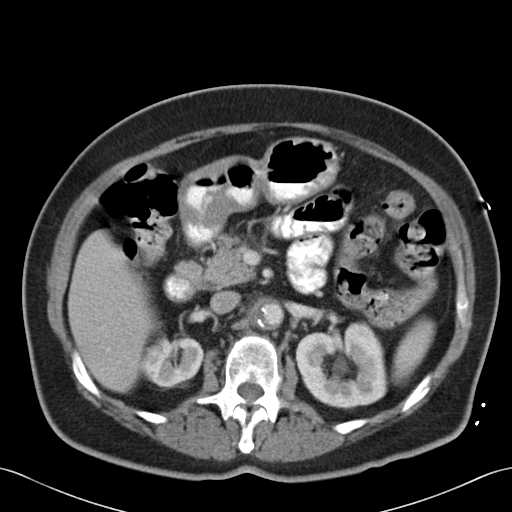
[im 66/85  soft-tissue]
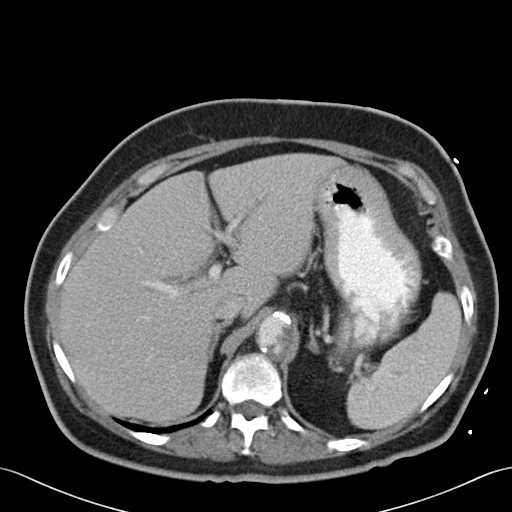
[im 66/85  lung]
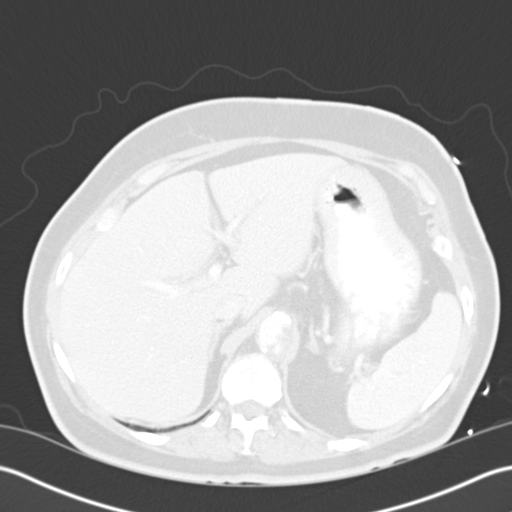
[im 66/85  bone]
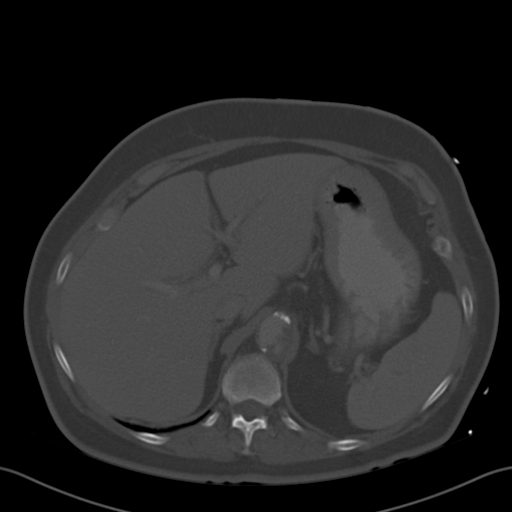
[im 71/85  soft-tissue]
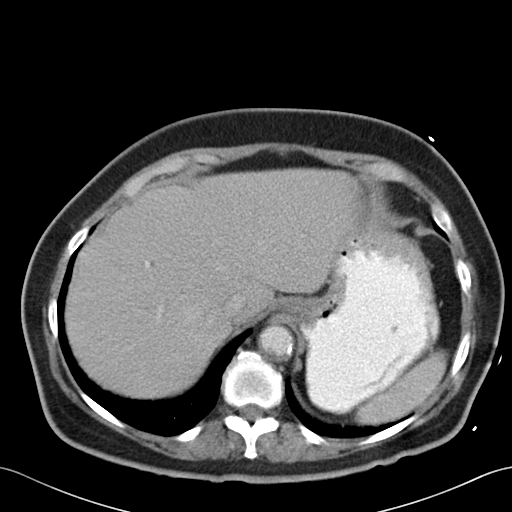
[im 71/85  lung]
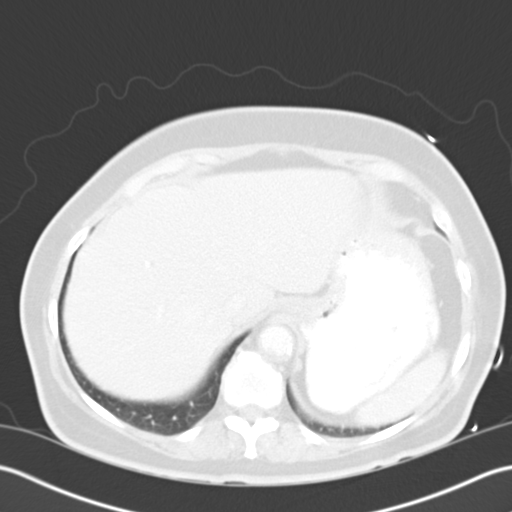
[im 75/85  lung]
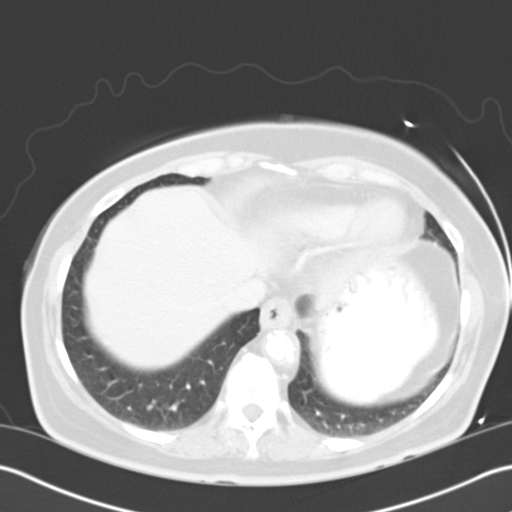
[im 80/85  soft-tissue]
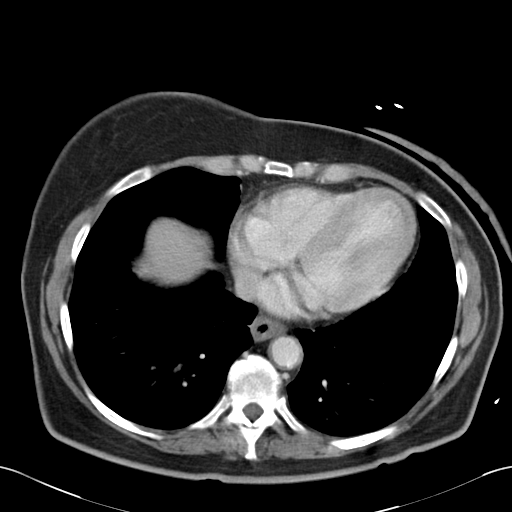
[im 80/85  lung]
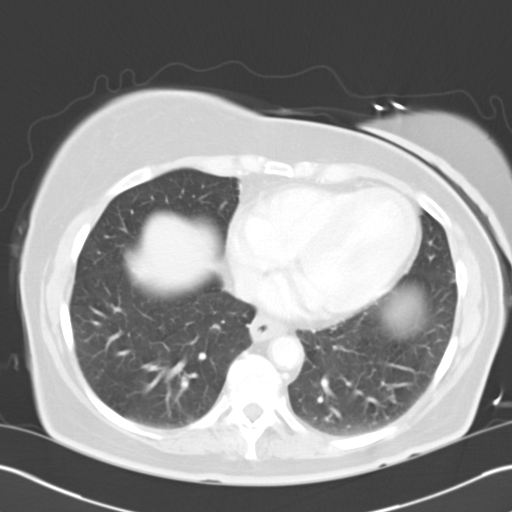

[12 of 32 positions shown; findings below may reference images not displayed]

FINDINGS: Lower chest: Left mastectomy. No evidence of recurrent disease in
the lower chest wall. Heart mildly enlarged. Visualized lung bases
clear.

Hepatobiliary: Liver normal in size and appearance. Gallbladder
surgically absent. No unexpected biliary ductal dilation.

Pancreas: Normal in appearance without evidence of mass, ductal
dilation, or inflammation.

Spleen: Normal in size and appearance.

Adrenals/Urinary Tract: Normal appearing adrenal glands. Atrophic
right kidney with severe diffuse cortical thinning. Compensatory
hypertrophy of the left kidney. Nonobstructing approximate 3 mm
calculus in a mid calix of the left kidney. No urinary tract calculi
elsewhere on either side. Approximate 2 cm simple cyst arising from
the lower pole of the left kidney. Subcentimeter simple cyst arising
from the mid right kidney. No solid mass involving either kidney.
Normal-appearing urinary bladder.

Stomach/Bowel: Circumferential wall thickening involving the
visualized distal esophagus extending to the esophagogastric
junction. Stomach normal in appearance for the degree of distention.
Descending and sigmoid colon diverticulosis without evidence of
acute diverticulitis. Sigmoid colon tortuous and redundant. Expected
stool burden. Normal appendix in the right upper pelvis.

Vascular/Lymphatic: Severe aortoiliofemoral atherosclerosis with
mixed calcified and noncalcified plaque. Distal right common iliac
artery aneurysm measuring up to 1.6 cm diameter. Possible
hemodynamically significant stenosis involving the right common
iliac artery due to mural thrombus in the aneurysm. Occluded left
common iliac artery with reconstitution of the external iliac artery
via internal iliac collaterals. Stenosis at the origin of the right
renal artery. Celiac, SMA and IMA patent though atherosclerotic.

No pathologic lymphadenopathy.

Reproductive: Atrophic uterus and ovaries consistent with age. No
adnexal masses.

Other: None.

Musculoskeletal: Degenerative disc disease and spondylosis involving
multiple levels of the lower thoracic spine, L1-2, and L5-S1.
IMPRESSION: 1. No acute abnormalities involving the abdomen or pelvis.
2. Nonobstructing 3 mm calculus in a mid calix of the left kidney.
3. Circumferential wall thickening involving the visualized distal
esophagus extending to the esophagogastric junction. Query GE reflux
disease though there is no evidence of hiatal hernia.
4. Descending and sigmoid colon diverticulosis without evidence of
acute diverticulitis.
5. Severe generalized atherosclerosis with mixed calcified and
noncalcified plaque.
6. 1.6 cm distal right common iliac artery aneurysm. Possible
hemodynamically significant stenosis involving the right common
iliac artery.
7. Occlusion of the left common iliac artery with reconstitution of
the external iliac artery via internal iliac collaterals.

## 2017-03-05 ENCOUNTER — Ambulatory Visit: Payer: Self-pay | Admitting: Family Medicine

## 2017-04-08 NOTE — Telephone Encounter (Signed)
Closing out old MyChart message

## 2017-04-08 NOTE — Telephone Encounter (Signed)
Closing out scanned document note open since  May 2018 Allergy was added as requested Signing off on old note

## 2017-05-06 ENCOUNTER — Other Ambulatory Visit: Payer: Self-pay | Admitting: Family Medicine

## 2017-05-28 ENCOUNTER — Other Ambulatory Visit: Payer: Self-pay | Admitting: Family Medicine

## 2017-07-13 ENCOUNTER — Other Ambulatory Visit: Payer: Self-pay | Admitting: Family Medicine

## 2017-07-13 ENCOUNTER — Other Ambulatory Visit (INDEPENDENT_AMBULATORY_CARE_PROVIDER_SITE_OTHER): Payer: Self-pay | Admitting: Vascular Surgery

## 2017-07-17 NOTE — Telephone Encounter (Signed)
Patient was seen July 2018.  She can have one more refill.  She needs to make a yearly appointment for [place] to continue giving her this medication.

## 2017-07-25 ENCOUNTER — Telehealth: Payer: Self-pay | Admitting: *Deleted

## 2017-07-25 DIAGNOSIS — Z122 Encounter for screening for malignant neoplasm of respiratory organs: Secondary | ICD-10-CM

## 2017-07-25 DIAGNOSIS — Z87891 Personal history of nicotine dependence: Secondary | ICD-10-CM

## 2017-07-25 NOTE — Telephone Encounter (Signed)
Notified patient that annual lung cancer screening low dose CT scan is due currently or will be in near future. Confirmed that patient is within the age range of 55-77, and asymptomatic, (no signs or symptoms of lung cancer). Patient denies illness that would prevent curative treatment for lung cancer if found. Verified smoking history, (current, 44 pack year). The shared decision making visit was done 05/08/14. Patient is agreeable for CT scan being scheduled.

## 2017-07-30 ENCOUNTER — Ambulatory Visit
Admission: RE | Admit: 2017-07-30 | Discharge: 2017-07-30 | Disposition: A | Discharge status: Home / Self Care | Payer: Medicare HMO | Source: Ambulatory Visit | Attending: Nurse Practitioner | Admitting: Nurse Practitioner

## 2017-07-30 DIAGNOSIS — Z87891 Personal history of nicotine dependence: Secondary | ICD-10-CM | POA: Insufficient documentation

## 2017-07-30 DIAGNOSIS — J432 Centrilobular emphysema: Secondary | ICD-10-CM | POA: Diagnosis not present

## 2017-07-30 DIAGNOSIS — K760 Fatty (change of) liver, not elsewhere classified: Secondary | ICD-10-CM | POA: Insufficient documentation

## 2017-07-30 DIAGNOSIS — I2510 Atherosclerotic heart disease of native coronary artery without angina pectoris: Secondary | ICD-10-CM | POA: Insufficient documentation

## 2017-07-30 DIAGNOSIS — Z122 Encounter for screening for malignant neoplasm of respiratory organs: Secondary | ICD-10-CM | POA: Diagnosis present

## 2017-07-30 DIAGNOSIS — J438 Other emphysema: Secondary | ICD-10-CM | POA: Diagnosis not present

## 2017-07-30 DIAGNOSIS — I700 Atherosclerosis of aorta: Secondary | ICD-10-CM | POA: Diagnosis not present

## 2017-08-06 ENCOUNTER — Encounter: Payer: Self-pay | Admitting: *Deleted

## 2017-08-16 ENCOUNTER — Other Ambulatory Visit: Payer: Self-pay | Admitting: Family Medicine

## 2017-09-07 ENCOUNTER — Encounter (INDEPENDENT_AMBULATORY_CARE_PROVIDER_SITE_OTHER): Payer: Medicare Other

## 2017-09-07 ENCOUNTER — Ambulatory Visit (INDEPENDENT_AMBULATORY_CARE_PROVIDER_SITE_OTHER): Payer: Medicare Other | Admitting: Vascular Surgery

## 2017-11-02 ENCOUNTER — Encounter (INDEPENDENT_AMBULATORY_CARE_PROVIDER_SITE_OTHER): Payer: Medicare Other

## 2017-11-02 ENCOUNTER — Encounter

## 2017-11-02 ENCOUNTER — Ambulatory Visit (INDEPENDENT_AMBULATORY_CARE_PROVIDER_SITE_OTHER): Payer: Medicare Other | Admitting: Vascular Surgery

## 2017-11-28 ENCOUNTER — Other Ambulatory Visit: Payer: Self-pay | Admitting: Family Medicine

## 2017-11-28 DIAGNOSIS — Z13820 Encounter for screening for osteoporosis: Secondary | ICD-10-CM

## 2017-11-28 DIAGNOSIS — Z780 Asymptomatic menopausal state: Secondary | ICD-10-CM

## 2017-11-30 ENCOUNTER — Encounter (INDEPENDENT_AMBULATORY_CARE_PROVIDER_SITE_OTHER): Payer: Self-pay

## 2018-01-12 ENCOUNTER — Other Ambulatory Visit: Payer: Self-pay | Admitting: Family Medicine

## 2018-01-12 ENCOUNTER — Other Ambulatory Visit (INDEPENDENT_AMBULATORY_CARE_PROVIDER_SITE_OTHER): Payer: Self-pay | Admitting: Vascular Surgery

## 2018-01-14 ENCOUNTER — Other Ambulatory Visit (INDEPENDENT_AMBULATORY_CARE_PROVIDER_SITE_OTHER): Payer: Self-pay | Admitting: Vascular Surgery

## 2018-01-14 DIAGNOSIS — I679 Cerebrovascular disease, unspecified: Secondary | ICD-10-CM

## 2018-01-15 ENCOUNTER — Encounter

## 2018-01-15 ENCOUNTER — Ambulatory Visit (INDEPENDENT_AMBULATORY_CARE_PROVIDER_SITE_OTHER): Payer: Medicare HMO | Admitting: Vascular Surgery

## 2018-01-15 ENCOUNTER — Ambulatory Visit (INDEPENDENT_AMBULATORY_CARE_PROVIDER_SITE_OTHER): Payer: Medicare Other | Admitting: Vascular Surgery

## 2018-01-15 ENCOUNTER — Encounter (INDEPENDENT_AMBULATORY_CARE_PROVIDER_SITE_OTHER): Payer: Medicare HMO

## 2018-01-15 ENCOUNTER — Encounter (INDEPENDENT_AMBULATORY_CARE_PROVIDER_SITE_OTHER): Payer: Medicare Other

## 2018-03-05 ENCOUNTER — Ambulatory Visit (INDEPENDENT_AMBULATORY_CARE_PROVIDER_SITE_OTHER): Payer: Medicare HMO

## 2018-03-05 ENCOUNTER — Ambulatory Visit (INDEPENDENT_AMBULATORY_CARE_PROVIDER_SITE_OTHER): Payer: Medicare HMO | Admitting: Vascular Surgery

## 2018-03-05 ENCOUNTER — Encounter (INDEPENDENT_AMBULATORY_CARE_PROVIDER_SITE_OTHER): Payer: Self-pay | Admitting: Vascular Surgery

## 2018-03-05 VITALS — BP 139/72 | HR 66 | Resp 16 | Ht 61.5 in | Wt 153.8 lb

## 2018-03-05 DIAGNOSIS — I10 Essential (primary) hypertension: Secondary | ICD-10-CM | POA: Diagnosis not present

## 2018-03-05 DIAGNOSIS — E1151 Type 2 diabetes mellitus with diabetic peripheral angiopathy without gangrene: Secondary | ICD-10-CM | POA: Diagnosis not present

## 2018-03-05 DIAGNOSIS — E785 Hyperlipidemia, unspecified: Secondary | ICD-10-CM | POA: Diagnosis not present

## 2018-03-05 DIAGNOSIS — I679 Cerebrovascular disease, unspecified: Secondary | ICD-10-CM

## 2018-03-05 DIAGNOSIS — I6523 Occlusion and stenosis of bilateral carotid arteries: Secondary | ICD-10-CM | POA: Diagnosis not present

## 2018-03-05 NOTE — Assessment & Plan Note (Signed)
Her left carotid endarterectomy site is patent but there are elevated velocities which is likely a combination of some intimal hyperplasia, but more so from tortuosity and compensatory flow with contralateral occlusion.  Although her velocities fall in the lower end of the 60 to 79% range, previously the stenosis has not been that bad. She will continue Plavix.  She has had to stop statins, so if there are any other options for her lipids that would be an option, I am ok with that.  We will recheck in six months.

## 2018-03-05 NOTE — Progress Notes (Signed)
MRN : [id number]  Julia Cunningham is a 73 y.o. (Nov 08, 1945) female who presents with chief complaint of  Chief Complaint  Patient presents with  . Follow-up  .  History of Present Illness: Patient returns in follow-up of her carotid disease.  She is doing well without any new focal neurologic symptoms. Specifically, the patient denies amaurosis fugax, speech or swallowing difficulties, or arm or leg weakness or numbness.  He has had to stop taking her statin agent secondary to myalgias in her legs.  Her noninvasive studies today show essentially no change from her previous study with a known occlusion of the right carotid artery.  Her left carotid endarterectomy site is patent but there are elevated velocities which is likely a combination of some intimal hyperplasia, but more so from tortuosity and compensatory flow with contralateral occlusion.  Although her velocities fall in the lower end of the 60 to 79% range, previously the stenosis has not been that bad.  Current Outpatient Medications  Medication Sig Dispense Refill  . amLODipine (NORVASC) 5 MG tablet TAKE 1 TABLET  DAILY. FOR BLOOD PRESSURE 90 tablet 3  . clopidogrel (PLAVIX) 75 MG tablet TAKE 1 TABLET EVERY DAY 90 tablet 3  . glimepiride (AMARYL) 4 MG tablet Take 1 tablet (4 mg total) by mouth daily before breakfast. 90 tablet 3  . glucose blood (TRUETEST TEST) test strip Check fingerstick sugars once a day on average; Dx E11.9; LON 99 months 100 each 1  . Krill Oil 350 MG CAPS Take by mouth daily.    . Lancets 30G MISC Reli-On brand requested by patient; check fingerstick sugars once a day on average; Dx E11.9 100 each 1  . lisinopril (PRINIVIL,ZESTRIL) 2.5 MG tablet Take 2.5 mg by mouth daily.    . meclizine (ANTIVERT) 25 MG tablet     . metFORMIN (GLUCOPHAGE) 500 MG tablet Take by mouth 2 (two) times daily with a meal.    . metoprolol tartrate (LOPRESSOR) 50 MG tablet Take 0.5 tablets (25 mg total) by mouth 2 (two) times daily.  90 tablet 1  . pantoprazole (PROTONIX) 20 MG tablet Take 20 mg by mouth daily.     No current facility-administered medications for this visit.     Past Medical History:  Diagnosis Date  . Aortic atherosclerosis ([place])   . Atherosclerosis of right carotid artery   . Breast cancer ([place])    Breast left   . Collagen vascular disease ([place])   . Controlled type 2 diabetes mellitus with circulatory disorder, without long-term current use of insulin ([place])   . Diabetes mellitus without complication ([place])   . DNAR (do not attempt resuscitation) 09/14/2016   Discussed with patient, DNR confirmed September 14, 2016  . GI bleed not requiring more than 4 units of blood in 24 hours, ICU, or surgery Aug 15-17, 2015   2 units PRBCs  . Hypertension   . Hypertriglyceridemia   . Personal history of tobacco use, presenting hazards to health 06/18/2015  . Tobacco abuse   . Vertebral artery occlusion     Past Surgical History:  Procedure Laterality Date  . BREAST SURGERY Left 02/21/1988   Mastectomy  . CAROTID ENDARTERECTOMY Left July 2015  . CHOLECYSTECTOMY  02/20/1978   Social History  Substance Use Topics  . Smoking status: Current Every Day Smoker    Packs/day: 1.00    Years: 50.00    Types: Cigarettes  . Smokeless tobacco: Never Used  . Alcohol use No  Married  Family History      Family History  Problem Relation Age of Onset  . Heart disease Mother        in her 38's  . Stroke Mother        48 yo  . Hypertension Mother   . Heart attack Mother   . Heart disease Father        65 yo  . Diabetes Father   . Asthma Father   . Heart attack Father   . Hypertension Father   . Cancer Neg Hx   . COPD Neg Hx          Allergies  Allergen Reactions  . Atorvastatin Other (See Comments)    myalgias     REVIEW OF SYSTEMS (Negative unless checked)  Constitutional: [] ?Weight loss  [] ?Fever  [] ?Chills Cardiac: [] ?Chest pain   [] ?Chest pressure   [] ?Palpitations    [] ?Shortness of breath when laying flat   [] ?Shortness of breath at rest   [x] ?Shortness of breath with exertion. Vascular:  [] ?Pain in legs with walking   [] ?Pain in legs at rest   [] ?Pain in legs when laying flat   [] ?Claudication   [] ?Pain in feet when walking  [] ?Pain in feet at rest  [] ?Pain in feet when laying flat   [] ?History of DVT   [] ?Phlebitis   [] ?Swelling in legs   [] ?Varicose veins   [] ?Non-healing ulcers Pulmonary:   [] ?Uses home oxygen   [] ?Productive cough   [] ?Hemoptysis   [] ?Wheeze  [] ?COPD   [] ?Asthma Neurologic:  [x] ?Dizziness  [] ?Blackouts   [] ?Seizures   [] ?History of stroke   [] ?History of TIA  [] ?Aphasia   [] ?Temporary blindness   [] ?Dysphagia   [] ?Weakness or numbness in arms   [] ?Weakness or numbness in legs Musculoskeletal:  [] ?Arthritis   [] ?Joint swelling   [] ?Joint pain   [] ?Low back pain Hematologic:  [] ?Easy bruising  [] ?Easy bleeding   [] ?Hypercoagulable state   [] ?Anemic  [] ?Hepatitis Gastrointestinal:  [] ?Blood in stool   [] ?Vomiting blood  [] ?Gastroesophageal reflux/heartburn   [] ?Difficulty swallowing. Genitourinary:  [] ?Chronic kidney disease   [] ?Difficult urination  [] ?Frequent urination  [] ?Burning with urination   [] ?Blood in urine Skin:  [] ?Rashes   [] ?Ulcers   [] ?Wounds Psychological:  [] ?History of anxiety   [] ? History of major depression.     Physical Examination  Vitals:   03/05/18 1409  BP: 139/72  Pulse: 66  Resp: 16  Weight: 153 lb 12.8 oz (69.8 kg)  Height: 5' 1.5" (1.562 m)   Body mass index is 28.59 kg/m. Gen:  WD/WN, NAD Head: [place]/AT, No temporalis wasting. Ear/Nose/Throat: Hearing grossly intact, nares w/o erythema or drainage, trachea midline Eyes: Conjunctiva clear. Sclera non-icteric Neck: Supple.  Left carotid bruit  Pulmonary:  Good air movement, equal and clear to auscultation bilaterally.  Cardiac: RRR, No JVD Vascular:  Vessel Right Left  Radial Palpable Palpable               Musculoskeletal: M/S 5/5  throughout.  No deformity or atrophy. No edema. Neurologic: CN 2-12 intact. Sensation grossly intact in extremities.  Symmetrical.  Speech is fluent. Motor exam as listed above. Psychiatric: Judgment intact, Mood & affect appropriate for pt's clinical situation. Dermatologic: No rashes or ulcers noted.  No cellulitis or open wounds.      CBC Lab Results  Component Value Date   WBC 11.3 (H) 08/03/2016   HGB 13.1 08/03/2016   HCT 40.6 08/03/2016   MCV 86.6 08/03/2016   PLT 314 08/03/2016  BMET    Component Value Date/Time   NA 139 08/03/2016 1421   NA 140 03/12/2015 1433   NA 147 (H) 10/05/2013 0454   K 4.7 08/03/2016 1421   K 3.4 (L) 10/05/2013 0454   CL 102 08/03/2016 1421   CL 113 (H) 10/05/2013 0454   CO2 24 08/03/2016 1421   CO2 25 10/05/2013 0454   GLUCOSE 154 (H) 08/03/2016 1421   GLUCOSE 143 (H) 10/05/2013 0454   BUN 15 08/03/2016 1421   BUN 19 03/12/2015 1433   BUN 7 10/05/2013 0454   CREATININE 0.96 08/03/2016 1421   CALCIUM 9.7 08/03/2016 1421   CALCIUM 7.9 (L) 10/05/2013 0454   GFRNONAA 61 08/03/2016 1421   GFRAA 70 08/03/2016 1421   CrCl cannot be calculated (Patient's most recent lab result is older than the maximum 21 days allowed.).  COAG Lab Results  Component Value Date   INR 1.1 10/04/2013   INR 1.2 09/19/2013   INR 1.0 07/17/2013    Radiology No results found.   Assessment/Plan Hyperlipidemia with target LDL less than 70 lipid control important in reducing the progression of atherosclerotic disease. Has has to stop statins from myalgias   Controlled type 2 diabetes mellitus with circulatory disorder, without long-term current use of insulin (HCC) blood glucose control important in reducing the progression of atherosclerotic disease. Also, involved in wound healing. On appropriate medications.   Essential hypertension, benign blood pressure control important in reducing the progression of atherosclerotic disease. On appropriate  oral medications.  Carotid stenosis Her left carotid endarterectomy site is patent but there are elevated velocities which is likely a combination of some intimal hyperplasia, but more so from tortuosity and compensatory flow with contralateral occlusion.  Although her velocities fall in the lower end of the 60 to 79% range, previously the stenosis has not been that bad. She will continue Plavix.  She has had to stop statins, so if there are any other options for her lipids that would be an option, I am ok with that.  We will recheck in six months.      Leotis Pain, MD  03/05/2018 2:42 PM    This note was created with Dragon medical transcription system.  Any errors from dictation are purely unintentional

## 2018-08-27 ENCOUNTER — Telehealth: Payer: Self-pay | Admitting: *Deleted

## 2018-08-27 NOTE — Telephone Encounter (Signed)
Contacted patient to schedule annual lung screening scan. Patient has to schedule and ultrasound and will let me know when that is scheduled so that we can combine appts if possible.

## 2018-09-04 ENCOUNTER — Ambulatory Visit (INDEPENDENT_AMBULATORY_CARE_PROVIDER_SITE_OTHER): Payer: Medicare HMO | Admitting: Nurse Practitioner

## 2018-09-04 ENCOUNTER — Encounter (INDEPENDENT_AMBULATORY_CARE_PROVIDER_SITE_OTHER): Payer: Medicare HMO

## 2018-09-09 ENCOUNTER — Other Ambulatory Visit: Payer: Self-pay | Admitting: Family Medicine

## 2018-09-09 DIAGNOSIS — R011 Cardiac murmur, unspecified: Secondary | ICD-10-CM

## 2018-09-18 ENCOUNTER — Ambulatory Visit (INDEPENDENT_AMBULATORY_CARE_PROVIDER_SITE_OTHER): Payer: Medicare HMO | Admitting: Nurse Practitioner

## 2018-09-18 ENCOUNTER — Encounter (INDEPENDENT_AMBULATORY_CARE_PROVIDER_SITE_OTHER): Payer: Medicare HMO

## 2018-10-10 ENCOUNTER — Encounter (INDEPENDENT_AMBULATORY_CARE_PROVIDER_SITE_OTHER): Payer: Medicare HMO

## 2018-10-10 ENCOUNTER — Ambulatory Visit (INDEPENDENT_AMBULATORY_CARE_PROVIDER_SITE_OTHER): Payer: Medicare HMO | Admitting: Nurse Practitioner

## 2018-11-12 ENCOUNTER — Telehealth: Payer: Self-pay | Admitting: *Deleted

## 2018-11-12 NOTE — Telephone Encounter (Signed)
Contacted in attempt to schedule lung screening scan. Patient [place] is [place] and doesn't know when she will be back but will call to schedule when she is.

## 2018-11-22 ENCOUNTER — Other Ambulatory Visit (INDEPENDENT_AMBULATORY_CARE_PROVIDER_SITE_OTHER): Payer: Self-pay | Admitting: Vascular Surgery

## 2018-12-09 ENCOUNTER — Other Ambulatory Visit (INDEPENDENT_AMBULATORY_CARE_PROVIDER_SITE_OTHER): Payer: Self-pay | Admitting: Vascular Surgery

## 2019-03-27 ENCOUNTER — Telehealth: Payer: Self-pay | Admitting: *Deleted

## 2019-03-27 NOTE — Telephone Encounter (Signed)
Contacted in attempt to schedule lung screening scan. Patient reports travel and MD appts have her too busy. Asks to consider screening in late March.

## 2019-03-31 DIAGNOSIS — E538 Deficiency of other specified B group vitamins: Secondary | ICD-10-CM | POA: Diagnosis not present

## 2019-03-31 DIAGNOSIS — Z7689 Persons encountering health services in other specified circumstances: Secondary | ICD-10-CM | POA: Diagnosis not present

## 2019-03-31 DIAGNOSIS — I700 Atherosclerosis of aorta: Secondary | ICD-10-CM | POA: Diagnosis not present

## 2019-03-31 DIAGNOSIS — Z0000 Encounter for general adult medical examination without abnormal findings: Secondary | ICD-10-CM | POA: Diagnosis not present

## 2019-03-31 DIAGNOSIS — I110 Hypertensive heart disease with heart failure: Secondary | ICD-10-CM | POA: Diagnosis not present

## 2019-03-31 DIAGNOSIS — Z853 Personal history of malignant neoplasm of breast: Secondary | ICD-10-CM | POA: Diagnosis not present

## 2019-03-31 DIAGNOSIS — Z7189 Other specified counseling: Secondary | ICD-10-CM | POA: Diagnosis not present

## 2019-03-31 DIAGNOSIS — E1151 Type 2 diabetes mellitus with diabetic peripheral angiopathy without gangrene: Secondary | ICD-10-CM | POA: Diagnosis not present

## 2019-03-31 DIAGNOSIS — Z1321 Encounter for screening for nutritional disorder: Secondary | ICD-10-CM | POA: Diagnosis not present

## 2019-03-31 DIAGNOSIS — I6521 Occlusion and stenosis of right carotid artery: Secondary | ICD-10-CM | POA: Diagnosis not present

## 2019-03-31 DIAGNOSIS — I723 Aneurysm of iliac artery: Secondary | ICD-10-CM | POA: Diagnosis not present

## 2019-03-31 DIAGNOSIS — R809 Proteinuria, unspecified: Secondary | ICD-10-CM | POA: Diagnosis not present

## 2019-03-31 DIAGNOSIS — E559 Vitamin D deficiency, unspecified: Secondary | ICD-10-CM | POA: Diagnosis not present

## 2019-03-31 DIAGNOSIS — Z79899 Other long term (current) drug therapy: Secondary | ICD-10-CM | POA: Diagnosis not present

## 2019-03-31 DIAGNOSIS — F321 Major depressive disorder, single episode, moderate: Secondary | ICD-10-CM | POA: Diagnosis not present

## 2019-03-31 DIAGNOSIS — E1165 Type 2 diabetes mellitus with hyperglycemia: Secondary | ICD-10-CM | POA: Diagnosis not present

## 2019-03-31 DIAGNOSIS — I10 Essential (primary) hypertension: Secondary | ICD-10-CM | POA: Diagnosis not present

## 2019-03-31 DIAGNOSIS — J431 Panlobular emphysema: Secondary | ICD-10-CM | POA: Diagnosis not present

## 2019-03-31 DIAGNOSIS — I25119 Atherosclerotic heart disease of native coronary artery with unspecified angina pectoris: Secondary | ICD-10-CM | POA: Diagnosis not present

## 2019-05-24 ENCOUNTER — Telehealth: Payer: Self-pay

## 2019-05-24 NOTE — Telephone Encounter (Signed)
Patient notified that it is time to schedule the low dose lung cancer screening CT scan.  She is not able to schedule at this time but would like to have it scheduled at the end of May or early June of this year.

## 2019-07-31 DIAGNOSIS — H25041 Posterior subcapsular polar age-related cataract, right eye: Secondary | ICD-10-CM | POA: Diagnosis not present

## 2019-07-31 DIAGNOSIS — E113393 Type 2 diabetes mellitus with moderate nonproliferative diabetic retinopathy without macular edema, bilateral: Secondary | ICD-10-CM | POA: Diagnosis not present

## 2019-08-05 ENCOUNTER — Telehealth: Payer: Self-pay | Admitting: *Deleted

## 2019-08-05 NOTE — Telephone Encounter (Signed)
(  08/05/19) Left message for pt to notify them that it is time to schedule annual low dose lung cancer screening CT scan. Instructed patient to call back to verify information prior to the scan being scheduled [facility]     

## 2019-08-29 DIAGNOSIS — I2510 Atherosclerotic heart disease of native coronary artery without angina pectoris: Secondary | ICD-10-CM | POA: Diagnosis not present

## 2019-08-29 DIAGNOSIS — H25041 Posterior subcapsular polar age-related cataract, right eye: Secondary | ICD-10-CM | POA: Diagnosis not present

## 2019-09-04 ENCOUNTER — Encounter: Payer: Self-pay | Admitting: Ophthalmology

## 2019-09-11 ENCOUNTER — Other Ambulatory Visit: Admission: RE | Admit: 2019-09-11 | Payer: Medicare HMO | Source: Ambulatory Visit

## 2019-09-11 NOTE — Discharge Instructions (Signed)

## 2019-09-15 ENCOUNTER — Ambulatory Visit
Admission: RE | Admit: 2019-09-15 | Discharge: 2019-09-15 | Disposition: A | Discharge status: Home / Self Care | Payer: Medicare HMO | Attending: Ophthalmology | Admitting: Ophthalmology

## 2019-09-15 ENCOUNTER — Encounter
Admission: RE | Disposition: A | Discharge status: Home / Self Care | Payer: Self-pay | Source: Home / Self Care | Attending: Ophthalmology

## 2019-09-15 ENCOUNTER — Other Ambulatory Visit: Payer: Self-pay

## 2019-09-15 ENCOUNTER — Ambulatory Visit: Payer: Medicare HMO | Admitting: Anesthesiology

## 2019-09-15 ENCOUNTER — Encounter: Payer: Self-pay | Admitting: Ophthalmology

## 2019-09-15 DIAGNOSIS — H2511 Age-related nuclear cataract, right eye: Secondary | ICD-10-CM | POA: Insufficient documentation

## 2019-09-15 DIAGNOSIS — I10 Essential (primary) hypertension: Secondary | ICD-10-CM | POA: Insufficient documentation

## 2019-09-15 DIAGNOSIS — E7800 Pure hypercholesterolemia, unspecified: Secondary | ICD-10-CM | POA: Insufficient documentation

## 2019-09-15 DIAGNOSIS — Z79899 Other long term (current) drug therapy: Secondary | ICD-10-CM | POA: Insufficient documentation

## 2019-09-15 DIAGNOSIS — Z9012 Acquired absence of left breast and nipple: Secondary | ICD-10-CM | POA: Diagnosis not present

## 2019-09-15 DIAGNOSIS — H25811 Combined forms of age-related cataract, right eye: Secondary | ICD-10-CM | POA: Diagnosis not present

## 2019-09-15 DIAGNOSIS — E785 Hyperlipidemia, unspecified: Secondary | ICD-10-CM | POA: Insufficient documentation

## 2019-09-15 DIAGNOSIS — Z7984 Long term (current) use of oral hypoglycemic drugs: Secondary | ICD-10-CM | POA: Insufficient documentation

## 2019-09-15 DIAGNOSIS — Z853 Personal history of malignant neoplasm of breast: Secondary | ICD-10-CM | POA: Insufficient documentation

## 2019-09-15 DIAGNOSIS — E1151 Type 2 diabetes mellitus with diabetic peripheral angiopathy without gangrene: Secondary | ICD-10-CM | POA: Diagnosis not present

## 2019-09-15 DIAGNOSIS — H25041 Posterior subcapsular polar age-related cataract, right eye: Secondary | ICD-10-CM | POA: Diagnosis not present

## 2019-09-15 DIAGNOSIS — I2510 Atherosclerotic heart disease of native coronary artery without angina pectoris: Secondary | ICD-10-CM | POA: Diagnosis not present

## 2019-09-15 DIAGNOSIS — E1136 Type 2 diabetes mellitus with diabetic cataract: Secondary | ICD-10-CM | POA: Insufficient documentation

## 2019-09-15 DIAGNOSIS — F17200 Nicotine dependence, unspecified, uncomplicated: Secondary | ICD-10-CM | POA: Diagnosis not present

## 2019-09-15 DIAGNOSIS — Z880 Allergy status to penicillin: Secondary | ICD-10-CM | POA: Insufficient documentation

## 2019-09-15 HISTORY — PX: CATARACT EXTRACTION W/PHACO: SHX586

## 2019-09-15 HISTORY — DX: Cardiac murmur, unspecified: R01.1

## 2019-09-15 HISTORY — DX: Dizziness and giddiness: R42

## 2019-09-15 HISTORY — DX: Peripheral vascular disease, unspecified: I73.9

## 2019-09-15 HISTORY — DX: Atherosclerotic heart disease of native coronary artery without angina pectoris: I25.10

## 2019-09-15 LAB — GLUCOSE, CAPILLARY
Glucose-Capillary: 220 mg/dL — ABNORMAL HIGH (ref 70–99)
Glucose-Capillary: 187 mg/dL — ABNORMAL HIGH (ref 70–99)

## 2019-09-15 SURGERY — PHACOEMULSIFICATION, CATARACT, WITH IOL INSERTION
Anesthesia: Monitor Anesthesia Care | Site: Eye | Laterality: Right

## 2019-09-15 MED ORDER — FENTANYL CITRATE (PF) 100 MCG/2ML IJ SOLN
INTRAMUSCULAR | Status: DC | PRN
  Administered 2019-09-15: 50 ug via INTRAVENOUS

## 2019-09-15 MED ORDER — BRIMONIDINE TARTRATE-TIMOLOL 0.2-0.5 % OP SOLN
OPHTHALMIC | Status: DC | PRN
  Administered 2019-09-15: 1 [drp] via OPHTHALMIC

## 2019-09-15 MED ORDER — MOXIFLOXACIN HCL 0.5 % OP SOLN
OPHTHALMIC | Status: DC | PRN
  Administered 2019-09-15: 0.2 mL via OPHTHALMIC

## 2019-09-15 MED ORDER — MIDAZOLAM HCL 2 MG/2ML IJ SOLN
INTRAMUSCULAR | Status: DC | PRN
  Administered 2019-09-15: 1 mg via INTRAVENOUS

## 2019-09-15 MED ORDER — ACETAMINOPHEN 325 MG PO TABS: 325.0000 mg | ORAL_TABLET | ORAL | Status: DC | PRN

## 2019-09-15 MED ORDER — TETRACAINE HCL 0.5 % OP SOLN
1.0000 [drp] | OPHTHALMIC | Status: DC | PRN
  Administered 2019-09-15 (×3): 1 [drp] via OPHTHALMIC

## 2019-09-15 MED ORDER — ACETAMINOPHEN 160 MG/5ML PO SOLN: 325.0000 mg | ORAL | Status: DC | PRN

## 2019-09-15 MED ORDER — LIDOCAINE HCL (PF) 2 % IJ SOLN
INTRAOCULAR | Status: DC | PRN
  Administered 2019-09-15: 2 mL

## 2019-09-15 MED ORDER — ARMC OPHTHALMIC DILATING DROPS
1.0000 "application " | OPHTHALMIC | Status: DC | PRN
  Administered 2019-09-15 (×3): 1 via OPHTHALMIC

## 2019-09-15 MED ORDER — EPINEPHRINE PF 1 MG/ML IJ SOLN
INTRAOCULAR | Status: DC | PRN
  Administered 2019-09-15: 83 mL via OPHTHALMIC

## 2019-09-15 MED ORDER — ONDANSETRON HCL 4 MG/2ML IJ SOLN: 4.0000 mg | Freq: Once | INTRAMUSCULAR | Status: DC | PRN

## 2019-09-15 MED ORDER — NA CHONDROIT SULF-NA HYALURON 40-17 MG/ML IO SOLN
INTRAOCULAR | Status: DC | PRN
  Administered 2019-09-15: 1 mL via INTRAOCULAR

## 2019-09-15 SURGICAL SUPPLY — 20 items
CANNULA ANT/CHMB 27G (MISCELLANEOUS) ×2 IMPLANT
CANNULA ANT/CHMB 27GA (MISCELLANEOUS) ×6 IMPLANT
GLOVE SURG LX 8.0 MICRO (GLOVE) ×2
GLOVE SURG LX STRL 8.0 MICRO (GLOVE) ×1 IMPLANT
GLOVE SURG TRIUMPH 8.0 PF LTX (GLOVE) ×3 IMPLANT
GOWN STRL REUS W/ TWL LRG LVL3 (GOWN DISPOSABLE) ×2 IMPLANT
GOWN STRL REUS W/TWL LRG LVL3 (GOWN DISPOSABLE) ×6
LENS IOL DIOP 14.0 (Intraocular Lens) ×3 IMPLANT
LENS IOL TECNIS MONO 14.0 (Intraocular Lens) IMPLANT
MARKER SKIN DUAL TIP RULER LAB (MISCELLANEOUS) ×3 IMPLANT
NDL FILTER BLUNT 18X1 1/2 (NEEDLE) ×1 IMPLANT
NEEDLE FILTER BLUNT 18X 1/2SAF (NEEDLE) ×2
NEEDLE FILTER BLUNT 18X1 1/2 (NEEDLE) ×1 IMPLANT
PACK EYE AFTER SURG (MISCELLANEOUS) ×3 IMPLANT
PACK OPTHALMIC (MISCELLANEOUS) ×3 IMPLANT
PACK PORFILIO (MISCELLANEOUS) ×3 IMPLANT
SYR 3ML LL SCALE MARK (SYRINGE) ×3 IMPLANT
SYR TB 1ML LUER SLIP (SYRINGE) ×3 IMPLANT
WATER STERILE IRR 250ML POUR (IV SOLUTION) ×3 IMPLANT
WIPE NON LINTING 3.25X3.25 (MISCELLANEOUS) ×3 IMPLANT

## 2019-09-15 NOTE — Transfer of Care (Signed)
Immediate Anesthesia Transfer of Care Note  Patient: Julia Cunningham  Procedure(s) Performed: CATARACT EXTRACTION PHACO AND INTRAOCULAR LENS PLACEMENT (IOC) RIGHT DIABETIC 14.23 01:30.6 (Right Eye)  Patient Location: PACU  Anesthesia Type: MAC  Level of Consciousness: awake, alert  and patient cooperative  Airway and Oxygen Therapy: Patient Spontanous Breathing and Patient connected to supplemental oxygen  Post-op Assessment: Post-op Vital signs reviewed, Patient's Cardiovascular Status Stable, Respiratory Function Stable, Patent Airway and No signs of Nausea or vomiting  Post-op Vital Signs: Reviewed and stable  Complications: No complications documented.

## 2019-09-15 NOTE — H&P (Signed)
All labs reviewed. Abnormal studies sent to patients PCP when indicated.  Previous H&P reviewed, patient examined, there are NO CHANGES.  Julia Wence Porfilio7/26/20219:10 AM

## 2019-09-15 NOTE — Anesthesia Preprocedure Evaluation (Addendum)
Anesthesia Evaluation  Patient identified by MRN, date of birth, ID band Patient awake    Reviewed: Allergy & Precautions, NPO status   Airway Mallampati: II  TM Distance: >3 FB     Dental   Pulmonary Current Smoker and Patient abstained from smoking.,    breath sounds clear to auscultation       Cardiovascular hypertension, + CAD and + Peripheral Vascular Disease   Rhythm:Regular Rate:Normal  HLD   Neuro/Psych    GI/Hepatic   Endo/Other  diabetes, Type 2, Oral Hypoglycemic Agents  Renal/GU      Musculoskeletal   Abdominal   Peds  Hematology   Anesthesia Other Findings   Reproductive/Obstetrics                            Anesthesia Physical Anesthesia Plan  ASA: III  Anesthesia Plan: MAC   Post-op Pain Management:    Induction: Intravenous  PONV Risk Score and Plan: TIVA, Midazolam and Treatment may vary due to age or medical condition  Airway Management Planned: Natural Airway and Nasal Cannula  Additional Equipment:   Intra-op Plan:   Post-operative Plan:   Informed Consent: I have reviewed the patients History and Physical, chart, labs and discussed the procedure including the risks, benefits and alternatives for the proposed anesthesia with the patient or authorized representative who has indicated his/her understanding and acceptance.       Plan Discussed with: CRNA  Anesthesia Plan Comments:         Anesthesia Quick Evaluation

## 2019-09-15 NOTE — Anesthesia Procedure Notes (Signed)
Procedure Name: MAC Date/Time: 09/15/2019 9:16 AM Performed by: Cameron Ali, CRNA Pre-anesthesia Checklist: Patient identified, Emergency Drugs available, Suction available, Timeout performed and Patient being monitored Patient Re-evaluated:Patient Re-evaluated prior to induction Oxygen Delivery Method: Nasal cannula Placement Confirmation: positive ETCO2

## 2019-09-15 NOTE — Anesthesia Postprocedure Evaluation (Signed)
Anesthesia Post Note  Patient: Julia Cunningham  Procedure(s) Performed: CATARACT EXTRACTION PHACO AND INTRAOCULAR LENS PLACEMENT (IOC) RIGHT DIABETIC 14.23 01:30.6 (Right Eye)     Patient location during evaluation: PACU Anesthesia Type: MAC Level of consciousness: awake Pain management: pain level controlled Vital Signs Assessment: post-procedure vital signs reviewed and stable Respiratory status: respiratory function stable Cardiovascular status: stable Postop Assessment: no apparent nausea or vomiting Anesthetic complications: no   No complications documented.  Veda Canning

## 2019-09-15 NOTE — Op Note (Signed)
PREOPERATIVE DIAGNOSIS:  Nuclear sclerotic cataract of the right eye.   POSTOPERATIVE DIAGNOSIS:  H25.041 Cataract   OPERATIVE PROCEDURE:@   SURGEON:  Birder Robson, MD.   ANESTHESIA:  Anesthesiologist: Veda Canning, MD CRNA: Cameron Ali, CRNA  1.      Managed anesthesia care. 2.      0.52ml of Shugarcaine was instilled in the eye following the paracentesis.   COMPLICATIONS:  None.   TECHNIQUE:   Stop and chop   DESCRIPTION OF PROCEDURE:  The patient was examined and consented in the preoperative holding area where the aforementioned topical anesthesia was applied to the right eye and then brought back to the Operating Room where the right eye was prepped and draped in the usual sterile ophthalmic fashion and a lid speculum was placed. A paracentesis was created with the side port blade and the anterior chamber was filled with viscoelastic. A near clear corneal incision was performed with the steel keratome. A continuous curvilinear capsulorrhexis was performed with a cystotome followed by the capsulorrhexis forceps. Hydrodissection and hydrodelineation were carried out with BSS on a blunt cannula. The lens was removed in a stop and chop  technique and the remaining cortical material was removed with the irrigation-aspiration handpiece. The capsular bag was inflated with viscoelastic and the Technis [id number]  lens was placed in the capsular bag without complication. The remaining viscoelastic was removed from the eye with the irrigation-aspiration handpiece. The wounds were hydrated. The anterior chamber was flushed with BSS and the eye was inflated to physiologic pressure. 0.71ml of Vigamox was placed in the anterior chamber. The wounds were found to be water tight. The eye was dressed with Combigan. The patient was given protective glasses to wear throughout the day and a shield with which to sleep tonight. The patient was also given drops with which to begin a drop regimen today and will  follow-up with me in one day. Implant Name Type Inv. Item Serial No. Manufacturer Lot No. LRB No. Used Action  LENS IOL DIOP 14.0 - [id number] Intraocular Lens LENS IOL DIOP 14.0 [id number] [employer]  Right 1 Implanted   Procedure(s): CATARACT EXTRACTION PHACO AND INTRAOCULAR LENS PLACEMENT (IOC) RIGHT DIABETIC 14.23 01:30.6 (Right)  Electronically signed: Birder Robson 09/15/2019 9:35 AM

## 2019-09-16 ENCOUNTER — Encounter: Payer: Self-pay | Admitting: Ophthalmology

## 2019-09-25 DIAGNOSIS — I2510 Atherosclerotic heart disease of native coronary artery without angina pectoris: Secondary | ICD-10-CM | POA: Diagnosis not present

## 2019-09-25 DIAGNOSIS — H2512 Age-related nuclear cataract, left eye: Secondary | ICD-10-CM | POA: Diagnosis not present

## 2019-09-30 ENCOUNTER — Encounter: Payer: Self-pay | Admitting: Ophthalmology

## 2019-10-03 ENCOUNTER — Other Ambulatory Visit
Admission: RE | Admit: 2019-10-03 | Discharge: 2019-10-03 | Disposition: A | Discharge status: Home / Self Care | Payer: Medicare HMO | Source: Ambulatory Visit | Attending: Pediatric Dentistry | Admitting: Pediatric Dentistry

## 2019-10-03 ENCOUNTER — Other Ambulatory Visit: Payer: Self-pay

## 2019-10-03 DIAGNOSIS — Z01812 Encounter for preprocedural laboratory examination: Secondary | ICD-10-CM | POA: Diagnosis not present

## 2019-10-03 DIAGNOSIS — Z20822 Contact with and (suspected) exposure to covid-19: Secondary | ICD-10-CM | POA: Diagnosis not present

## 2019-10-03 NOTE — Discharge Instructions (Signed)

## 2019-10-04 LAB — SARS CORONAVIRUS 2 (TAT 6-24 HRS): SARS Coronavirus 2: NEGATIVE

## 2019-10-07 ENCOUNTER — Encounter
Admission: RE | Disposition: A | Discharge status: Home / Self Care | Payer: Self-pay | Source: Home / Self Care | Attending: Ophthalmology

## 2019-10-07 ENCOUNTER — Ambulatory Visit: Payer: Medicare HMO | Admitting: Anesthesiology

## 2019-10-07 ENCOUNTER — Ambulatory Visit
Admission: RE | Admit: 2019-10-07 | Discharge: 2019-10-07 | Disposition: A | Discharge status: Home / Self Care | Payer: Medicare HMO | Attending: Ophthalmology | Admitting: Ophthalmology

## 2019-10-07 ENCOUNTER — Other Ambulatory Visit: Payer: Self-pay

## 2019-10-07 ENCOUNTER — Encounter: Payer: Self-pay | Admitting: Ophthalmology

## 2019-10-07 DIAGNOSIS — Z7984 Long term (current) use of oral hypoglycemic drugs: Secondary | ICD-10-CM | POA: Insufficient documentation

## 2019-10-07 DIAGNOSIS — E1151 Type 2 diabetes mellitus with diabetic peripheral angiopathy without gangrene: Secondary | ICD-10-CM | POA: Diagnosis not present

## 2019-10-07 DIAGNOSIS — I10 Essential (primary) hypertension: Secondary | ICD-10-CM | POA: Insufficient documentation

## 2019-10-07 DIAGNOSIS — H2512 Age-related nuclear cataract, left eye: Secondary | ICD-10-CM | POA: Diagnosis not present

## 2019-10-07 DIAGNOSIS — Z7902 Long term (current) use of antithrombotics/antiplatelets: Secondary | ICD-10-CM | POA: Insufficient documentation

## 2019-10-07 DIAGNOSIS — E1136 Type 2 diabetes mellitus with diabetic cataract: Secondary | ICD-10-CM | POA: Insufficient documentation

## 2019-10-07 DIAGNOSIS — E7800 Pure hypercholesterolemia, unspecified: Secondary | ICD-10-CM | POA: Insufficient documentation

## 2019-10-07 DIAGNOSIS — E785 Hyperlipidemia, unspecified: Secondary | ICD-10-CM | POA: Diagnosis not present

## 2019-10-07 DIAGNOSIS — I2510 Atherosclerotic heart disease of native coronary artery without angina pectoris: Secondary | ICD-10-CM | POA: Diagnosis not present

## 2019-10-07 DIAGNOSIS — F17200 Nicotine dependence, unspecified, uncomplicated: Secondary | ICD-10-CM | POA: Diagnosis not present

## 2019-10-07 DIAGNOSIS — Z880 Allergy status to penicillin: Secondary | ICD-10-CM | POA: Insufficient documentation

## 2019-10-07 DIAGNOSIS — Z79899 Other long term (current) drug therapy: Secondary | ICD-10-CM | POA: Diagnosis not present

## 2019-10-07 DIAGNOSIS — H25812 Combined forms of age-related cataract, left eye: Secondary | ICD-10-CM | POA: Diagnosis not present

## 2019-10-07 DIAGNOSIS — Z853 Personal history of malignant neoplasm of breast: Secondary | ICD-10-CM | POA: Diagnosis not present

## 2019-10-07 HISTORY — PX: CATARACT EXTRACTION W/PHACO: SHX586

## 2019-10-07 LAB — GLUCOSE, CAPILLARY
Glucose-Capillary: 179 mg/dL — ABNORMAL HIGH (ref 70–99)
Glucose-Capillary: 223 mg/dL — ABNORMAL HIGH (ref 70–99)

## 2019-10-07 SURGERY — PHACOEMULSIFICATION, CATARACT, WITH IOL INSERTION
Anesthesia: Monitor Anesthesia Care | Site: Eye | Laterality: Left

## 2019-10-07 MED ORDER — MIDAZOLAM HCL 2 MG/2ML IJ SOLN
INTRAMUSCULAR | Status: DC | PRN
  Administered 2019-10-07 (×2): 1 mg via INTRAVENOUS

## 2019-10-07 MED ORDER — BRIMONIDINE TARTRATE-TIMOLOL 0.2-0.5 % OP SOLN
OPHTHALMIC | Status: DC | PRN
  Administered 2019-10-07: 1 [drp] via OPHTHALMIC

## 2019-10-07 MED ORDER — LIDOCAINE HCL (PF) 2 % IJ SOLN
INTRAOCULAR | Status: DC | PRN
  Administered 2019-10-07: 2 mL

## 2019-10-07 MED ORDER — ARMC OPHTHALMIC DILATING DROPS
1.0000 "application " | OPHTHALMIC | Status: DC | PRN
  Administered 2019-10-07 (×3): 1 via OPHTHALMIC

## 2019-10-07 MED ORDER — FENTANYL CITRATE (PF) 100 MCG/2ML IJ SOLN
INTRAMUSCULAR | Status: DC | PRN
  Administered 2019-10-07: 50 ug via INTRAVENOUS

## 2019-10-07 MED ORDER — ONDANSETRON HCL 4 MG/2ML IJ SOLN: 4.0000 mg | Freq: Once | INTRAMUSCULAR | Status: DC | PRN

## 2019-10-07 MED ORDER — MOXIFLOXACIN HCL 0.5 % OP SOLN
OPHTHALMIC | Status: DC | PRN
  Administered 2019-10-07: 0.2 mL via OPHTHALMIC

## 2019-10-07 MED ORDER — NA CHONDROIT SULF-NA HYALURON 40-17 MG/ML IO SOLN
INTRAOCULAR | Status: DC | PRN
  Administered 2019-10-07: 1 mL via INTRAOCULAR

## 2019-10-07 MED ORDER — EPINEPHRINE PF 1 MG/ML IJ SOLN
INTRAOCULAR | Status: DC | PRN
  Administered 2019-10-07: 59 mL via OPHTHALMIC

## 2019-10-07 MED ORDER — LACTATED RINGERS IV SOLN: INTRAVENOUS | Status: DC

## 2019-10-07 MED ORDER — TETRACAINE HCL 0.5 % OP SOLN
1.0000 [drp] | OPHTHALMIC | Status: DC | PRN
  Administered 2019-10-07 (×3): 1 [drp] via OPHTHALMIC

## 2019-10-07 MED ORDER — ACETAMINOPHEN 160 MG/5ML PO SOLN: 325.0000 mg | ORAL | Status: DC | PRN

## 2019-10-07 MED ORDER — ACETAMINOPHEN 325 MG PO TABS: 325.0000 mg | ORAL_TABLET | ORAL | Status: DC | PRN

## 2019-10-07 SURGICAL SUPPLY — 20 items
CANNULA ANT/CHMB 27G (MISCELLANEOUS) ×2 IMPLANT
CANNULA ANT/CHMB 27GA (MISCELLANEOUS) ×6 IMPLANT
GLOVE SURG LX 8.0 MICRO (GLOVE) ×2
GLOVE SURG LX STRL 8.0 MICRO (GLOVE) ×1 IMPLANT
GLOVE SURG TRIUMPH 8.0 PF LTX (GLOVE) ×3 IMPLANT
GOWN STRL REUS W/ TWL LRG LVL3 (GOWN DISPOSABLE) ×2 IMPLANT
GOWN STRL REUS W/TWL LRG LVL3 (GOWN DISPOSABLE) ×6
LENS IOL DIOP 14.5 (Intraocular Lens) ×3 IMPLANT
LENS IOL TECNIS MONO 14.5 (Intraocular Lens) IMPLANT
MARKER SKIN DUAL TIP RULER LAB (MISCELLANEOUS) ×3 IMPLANT
NDL FILTER BLUNT 18X1 1/2 (NEEDLE) ×1 IMPLANT
NEEDLE FILTER BLUNT 18X 1/2SAF (NEEDLE) ×2
NEEDLE FILTER BLUNT 18X1 1/2 (NEEDLE) ×1 IMPLANT
PACK EYE AFTER SURG (MISCELLANEOUS) ×3 IMPLANT
PACK OPTHALMIC (MISCELLANEOUS) ×3 IMPLANT
PACK PORFILIO (MISCELLANEOUS) ×3 IMPLANT
SYR 3ML LL SCALE MARK (SYRINGE) ×3 IMPLANT
SYR TB 1ML LUER SLIP (SYRINGE) ×3 IMPLANT
WATER STERILE IRR 250ML POUR (IV SOLUTION) ×3 IMPLANT
WIPE NON LINTING 3.25X3.25 (MISCELLANEOUS) ×3 IMPLANT

## 2019-10-07 NOTE — Anesthesia Preprocedure Evaluation (Signed)
Anesthesia Evaluation  Patient identified by MRN, date of birth, ID band Patient awake    Reviewed: Allergy & Precautions, NPO status   Airway Mallampati: II  TM Distance: >3 FB     Dental no notable dental hx.    Pulmonary Current Smoker and Patient abstained from smoking.,    Pulmonary exam normal breath sounds clear to auscultation       Cardiovascular hypertension, + CAD and + Peripheral Vascular Disease  Normal cardiovascular exam+ Valvular Problems/Murmurs  Rhythm:Regular Rate:Normal  HLD   Neuro/Psych    GI/Hepatic   Endo/Other  diabetes, Type 2, Oral Hypoglycemic Agents  Renal/GU Renal disease     Musculoskeletal   Abdominal   Peds  Hematology  (+) Blood dyscrasia, anemia ,   Anesthesia Other Findings   Reproductive/Obstetrics                             Anesthesia Physical  Anesthesia Plan  ASA: III  Anesthesia Plan: MAC   Post-op Pain Management:    Induction: Intravenous  PONV Risk Score and Plan: Treatment may vary due to age or medical condition  Airway Management Planned: Natural Airway and Nasal Cannula  Additional Equipment:   Intra-op Plan:   Post-operative Plan:   Informed Consent: I have reviewed the patients History and Physical, chart, labs and discussed the procedure including the risks, benefits and alternatives for the proposed anesthesia with the patient or authorized representative who has indicated his/her understanding and acceptance.       Plan Discussed with: CRNA and Anesthesiologist  Anesthesia Plan Comments:         Anesthesia Quick Evaluation  Patient Active Problem List   Diagnosis Date Noted  . DNAR (do not attempt resuscitation) 09/14/2016  . Carotid stenosis 09/01/2016  . Hyperlipidemia with target LDL less than 70 08/07/2016  . Left carotid bruit 08/03/2016  . Cerebellar dysmetria 08/03/2016  . Preventative health  care 08/09/2015  . Hx of breast cancer 08/03/2015  . Personal history of tobacco use, presenting hazards to health 06/18/2015  . B12 deficiency 05/20/2015  . Iliac artery aneurysm, right ([place]) 04/28/2015  . Esophageal thickening 04/28/2015  . Acquired atrophy of kidney 04/28/2015  . Microcytic hypochromic anemia 03/15/2015  . Chronic bilateral low back pain 03/12/2015  . Atherosclerotic peripheral vascular disease with ulceration ([place]) 03/12/2015  . Claudication ([place]) 03/12/2015  . Medication monitoring encounter 12/07/2014  . Essential hypertension, benign 05/23/2014  . Cardiac murmur 05/23/2014  . Peripheral vascular disease ([place]) 05/23/2014  . Proteinuria 05/23/2014  . Hypertriglyceridemia   . Atherosclerosis of right carotid artery   . Tobacco abuse   . Controlled type 2 diabetes mellitus with circulatory disorder, without long-term current use of insulin ([place])   . Aortic atherosclerosis ([place])   . Vertebral artery occlusion     CBC Latest Ref Rng & Units 08/03/2016 09/27/2015 07/06/2015  WBC 3.8 - 10.8 K/uL 11.3(H) 10.6 12.2(H)  Hemoglobin 11.7 - 15.5 g/dL 13.1 13.1 11.7  Hematocrit 35 - 45 % 40.6 39.3 37.7  Platelets 140 - 400 K/uL 314 267 418(H)   BMP Latest Ref Rng & Units 08/03/2016 04/06/2016 09/27/2015  Glucose 65 - 99 mg/dL 154(H) 150(H) 124(H)  BUN 7 - 25 mg/dL _0 Creatinine 0.50 - 0.99 mg/dL 0.96 0.98 0.75  BUN/Creat Ratio 11 - 26 - - -  Sodium 135 - 146 mmol/L 139 142 140  Potassium 3.5 - 5.3 mmol/L  4.7 4.6 4.7  Chloride 98 - 110 mmol/L 102 104 104  CO2 20 - 31 mmol/L _0 Calcium 8.6 - 10.4 mg/dL 9.7 9.7 10.0    Risks and benefits of anesthesia discussed at length, patient or surrogate demonstrates understanding. Appropriately NPO. Plan to proceed with anesthesia.  Champ Mungo, MD 10/07/19

## 2019-10-07 NOTE — Transfer of Care (Signed)
Immediate Anesthesia Transfer of Care Note  Patient: Julia Cunningham  Procedure(s) Performed: CATARACT EXTRACTION PHACO AND INTRAOCULAR LENS PLACEMENT (IOC) LEFT DIABETIC (Left Eye)  Patient Location: PACU  Anesthesia Type: MAC  Level of Consciousness: awake, alert  and patient cooperative  Airway and Oxygen Therapy: Patient Spontanous Breathing and Patient connected to supplemental oxygen  Post-op Assessment: Post-op Vital signs reviewed, Patient's Cardiovascular Status Stable, Respiratory Function Stable, Patent Airway and No signs of Nausea or vomiting  Post-op Vital Signs: Reviewed and stable  Complications: No complications documented.

## 2019-10-07 NOTE — Anesthesia Procedure Notes (Signed)
Procedure Name: MAC Date/Time: 10/07/2019 7:59 AM Performed by: Silvana Newness, CRNA Pre-anesthesia Checklist: Patient identified, Emergency Drugs available, Suction available, Patient being monitored and Timeout performed Patient Re-evaluated:Patient Re-evaluated prior to induction Oxygen Delivery Method: Nasal cannula Placement Confirmation: positive ETCO2

## 2019-10-07 NOTE — Op Note (Signed)
PREOPERATIVE DIAGNOSIS:  Nuclear sclerotic cataract of the left eye.   POSTOPERATIVE DIAGNOSIS:  Nuclear sclerotic cataract of the left eye.   OPERATIVE PROCEDURE:@   SURGEON:  Birder Robson, MD.   ANESTHESIA:  Anesthesiologist: Sinda Du, MD CRNA: Silvana Newness, CRNA  1.      Managed anesthesia care. 2.     0.54ml of Shugarcaine was instilled following the paracentesis   COMPLICATIONS:  None.   TECHNIQUE:   Stop and chop   DESCRIPTION OF PROCEDURE:  The patient was examined and consented in the preoperative holding area where the aforementioned topical anesthesia was applied to the left eye and then brought back to the Operating Room where the left eye was prepped and draped in the usual sterile ophthalmic fashion and a lid speculum was placed. A paracentesis was created with the side port blade and the anterior chamber was filled with viscoelastic. A near clear corneal incision was performed with the steel keratome. A continuous curvilinear capsulorrhexis was performed with a cystotome followed by the capsulorrhexis forceps. Hydrodissection and hydrodelineation were carried out with BSS on a blunt cannula. The lens was removed in a stop and chop  technique and the remaining cortical material was removed with the irrigation-aspiration handpiece. The capsular bag was inflated with viscoelastic and the Technis [id number] lens was placed in the capsular bag without complication. The remaining viscoelastic was removed from the eye with the irrigation-aspiration handpiece. The wounds were hydrated. The anterior chamber was flushed with BSS and the eye was inflated to physiologic pressure. 0.44ml Vigamox was placed in the anterior chamber. The wounds were found to be water tight. The eye was dressed with Combigan. The patient was given protective glasses to wear throughout the day and a shield with which to sleep tonight. The patient was also given drops with which to begin a drop regimen today and  will follow-up with me in one day. Implant Name Type Inv. Item Serial No. Manufacturer Lot No. LRB No. Used Action  LENS IOL DIOP 14.5 - [id number] Intraocular Lens LENS IOL DIOP 14.5 [id number] [employer]  Left 1 Implanted    Procedure(s): CATARACT EXTRACTION PHACO AND INTRAOCULAR LENS PLACEMENT (IOC) LEFT DIABETIC 4.71 00:32.6 (Left)  Electronically signed: Birder Robson 10/07/2019 8:10 AM

## 2019-10-07 NOTE — H&P (Signed)
All labs reviewed. Abnormal studies sent to patients PCP when indicated.  Previous H&P reviewed, patient examined, there are NO CHANGES.  Julia Felling Porfilio8/17/20217:47 AM

## 2019-10-07 NOTE — Anesthesia Postprocedure Evaluation (Signed)
Anesthesia Post Note  Patient: Julia Cunningham  Procedure(s) Performed: CATARACT EXTRACTION PHACO AND INTRAOCULAR LENS PLACEMENT (IOC) LEFT DIABETIC 4.71 00:32.6 (Left Eye)     Patient location during evaluation: PACU Anesthesia Type: MAC Level of consciousness: awake and alert Pain management: pain level controlled Vital Signs Assessment: post-procedure vital signs reviewed and stable Respiratory status: spontaneous breathing, nonlabored ventilation, respiratory function stable and patient connected to nasal cannula oxygen Cardiovascular status: stable and blood pressure returned to baseline Postop Assessment: no apparent nausea or vomiting Anesthetic complications: no   No complications documented.  Sinda Du

## 2019-10-08 ENCOUNTER — Encounter: Payer: Self-pay | Admitting: Ophthalmology

## 2019-10-13 DIAGNOSIS — E538 Deficiency of other specified B group vitamins: Secondary | ICD-10-CM | POA: Diagnosis not present

## 2019-10-13 DIAGNOSIS — Z1329 Encounter for screening for other suspected endocrine disorder: Secondary | ICD-10-CM | POA: Diagnosis not present

## 2019-10-13 DIAGNOSIS — J431 Panlobular emphysema: Secondary | ICD-10-CM | POA: Diagnosis not present

## 2019-10-13 DIAGNOSIS — E119 Type 2 diabetes mellitus without complications: Secondary | ICD-10-CM | POA: Diagnosis not present

## 2019-10-13 DIAGNOSIS — E782 Mixed hyperlipidemia: Secondary | ICD-10-CM | POA: Diagnosis not present

## 2019-10-13 DIAGNOSIS — E559 Vitamin D deficiency, unspecified: Secondary | ICD-10-CM | POA: Diagnosis not present

## 2019-10-13 DIAGNOSIS — F321 Major depressive disorder, single episode, moderate: Secondary | ICD-10-CM | POA: Diagnosis not present

## 2019-10-13 DIAGNOSIS — I10 Essential (primary) hypertension: Secondary | ICD-10-CM | POA: Diagnosis not present

## 2019-10-13 DIAGNOSIS — R011 Cardiac murmur, unspecified: Secondary | ICD-10-CM | POA: Diagnosis not present

## 2019-11-18 ENCOUNTER — Encounter: Payer: Self-pay | Admitting: *Deleted

## 2020-05-26 ENCOUNTER — Emergency Department
Admission: EM | Admit: 2020-05-26 | Discharge: 2020-05-26 | Disposition: A | Discharge status: Home / Self Care | Payer: Medicare HMO | Attending: Emergency Medicine | Admitting: Emergency Medicine

## 2020-05-26 ENCOUNTER — Other Ambulatory Visit: Payer: Self-pay

## 2020-05-26 DIAGNOSIS — E1151 Type 2 diabetes mellitus with diabetic peripheral angiopathy without gangrene: Secondary | ICD-10-CM | POA: Diagnosis not present

## 2020-05-26 DIAGNOSIS — Z7902 Long term (current) use of antithrombotics/antiplatelets: Secondary | ICD-10-CM | POA: Insufficient documentation

## 2020-05-26 DIAGNOSIS — I2510 Atherosclerotic heart disease of native coronary artery without angina pectoris: Secondary | ICD-10-CM | POA: Diagnosis not present

## 2020-05-26 DIAGNOSIS — I10 Essential (primary) hypertension: Secondary | ICD-10-CM | POA: Diagnosis not present

## 2020-05-26 DIAGNOSIS — Z79899 Other long term (current) drug therapy: Secondary | ICD-10-CM | POA: Diagnosis not present

## 2020-05-26 DIAGNOSIS — E781 Pure hyperglyceridemia: Secondary | ICD-10-CM | POA: Diagnosis not present

## 2020-05-26 DIAGNOSIS — F17210 Nicotine dependence, cigarettes, uncomplicated: Secondary | ICD-10-CM | POA: Diagnosis not present

## 2020-05-26 DIAGNOSIS — R001 Bradycardia, unspecified: Secondary | ICD-10-CM | POA: Diagnosis not present

## 2020-05-26 DIAGNOSIS — Z853 Personal history of malignant neoplasm of breast: Secondary | ICD-10-CM | POA: Diagnosis not present

## 2020-05-26 DIAGNOSIS — E1169 Type 2 diabetes mellitus with other specified complication: Secondary | ICD-10-CM | POA: Diagnosis not present

## 2020-05-26 DIAGNOSIS — B029 Zoster without complications: Secondary | ICD-10-CM | POA: Diagnosis not present

## 2020-05-26 DIAGNOSIS — R519 Headache, unspecified: Secondary | ICD-10-CM | POA: Diagnosis present

## 2020-05-26 DIAGNOSIS — Z7984 Long term (current) use of oral hypoglycemic drugs: Secondary | ICD-10-CM | POA: Insufficient documentation

## 2020-05-26 DIAGNOSIS — M79601 Pain in right arm: Secondary | ICD-10-CM | POA: Diagnosis not present

## 2020-05-26 LAB — BASIC METABOLIC PANEL
Anion gap: 11 (ref 5–15)
BUN: 23 mg/dL (ref 8–23)
CO2: 22 mmol/L (ref 22–32)
Calcium: 9.2 mg/dL (ref 8.9–10.3)
Chloride: 101 mmol/L (ref 98–111)
Creatinine, Ser: 1.27 mg/dL — ABNORMAL HIGH (ref 0.44–1.00)
GFR, Estimated: 45 mL/min — ABNORMAL LOW (ref >60)
Glucose, Bld: 219 mg/dL — ABNORMAL HIGH (ref 70–99)
Potassium: 4.9 mmol/L (ref 3.5–5.1)
Sodium: 134 mmol/L — ABNORMAL LOW (ref 135–145)

## 2020-05-26 LAB — CBC
HCT: 44.9 % (ref 36.0–46.0)
Hemoglobin: 14.5 g/dL (ref 12.0–15.0)
MCH: 28.9 pg (ref 26.0–34.0)
MCHC: 32.3 g/dL (ref 30.0–36.0)
MCV: 89.6 fL (ref 80.0–100.0)
Platelets: 283 10*3/uL (ref 150–400)
RBC: 5.01 MIL/uL (ref 3.87–5.11)
RDW: 13.4 % (ref 11.5–15.5)
WBC: 11.4 10*3/uL — ABNORMAL HIGH (ref 4.0–10.5)
nRBC: 0 % (ref 0.0–0.2)

## 2020-05-26 LAB — TROPONIN I (HIGH SENSITIVITY)
Troponin I (High Sensitivity): 32 ng/L — ABNORMAL HIGH (ref <18)
Troponin I (High Sensitivity): 28 ng/L — ABNORMAL HIGH (ref <18)

## 2020-05-26 MED ORDER — HYDROCODONE-ACETAMINOPHEN 5-325 MG PO TABS
2.0000 | ORAL_TABLET | Freq: Once | ORAL | Status: AC
  Administered 2020-05-26: 2 via ORAL
  Filled 2020-05-26: qty 2

## 2020-05-26 MED ORDER — PREDNISONE 20 MG PO TABS: 40.0000 mg | ORAL_TABLET | Freq: Every day | ORAL | 0 refills | Status: AC

## 2020-05-26 MED ORDER — ONDANSETRON 4 MG PO TBDP: 4.0000 mg | ORAL_TABLET | Freq: Three times a day (TID) | ORAL | 0 refills | Status: AC | PRN

## 2020-05-26 MED ORDER — ONDANSETRON 4 MG PO TBDP
4.0000 mg | ORAL_TABLET | Freq: Once | ORAL | Status: AC
  Administered 2020-05-26: 4 mg via ORAL
  Filled 2020-05-26: qty 1

## 2020-05-26 MED ORDER — HYDROCODONE-ACETAMINOPHEN 5-325 MG PO TABS: 1.0000 | ORAL_TABLET | Freq: Four times a day (QID) | ORAL | 0 refills | Status: AC | PRN

## 2020-05-26 MED ORDER — VALACYCLOVIR HCL 1 G PO TABS: 1000.0000 mg | ORAL_TABLET | Freq: Three times a day (TID) | ORAL | 0 refills | Status: AC

## 2020-05-26 NOTE — ED Notes (Signed)
Pt states pain in right shoulder, rash to right arm and pain behind her right ear traveling to the top of her head. Pt states she takes "a lot of ibuprofen at home for pain." states she took 500mg  of ibuprofen this am. bp 205/80, pt did take her metoprolol and lisinopril this am. Alert and oriented, call light in reach, bed locked and low. NAD.

## 2020-05-26 NOTE — ED Notes (Signed)
Lab contacted to collect labs.

## 2020-05-26 NOTE — ED Triage Notes (Signed)
Pt to ER from home with complaints of a right sided stabbing headache in her temple and behind her ear.  Hx of neuralgia on the left side. Also developed a rash on her right arm this morning. Denies CP or SHOB. Hx of HTN and took her home lisinopril and metoprolol this morning with systolic averaging at 373. BP is 207/71 in triage.

## 2020-05-26 NOTE — ED Notes (Signed)
ED Provider at bedside. 

## 2020-05-26 NOTE — ED Provider Notes (Signed)
[facility] Emergency Department Provider Note  ____________________________________________   Event Date/Time   First MD Initiated Contact with Patient 05/26/20 1011     (approximate)  I have reviewed the triage vital signs and the nursing notes.   HISTORY  Chief Complaint Headache    HPI Julia Cunningham is a 75 y.o. female here with right-sided arm pain.  The patient states her symptoms started as a tingling and aching, sharp pain in her right paraspinal neck area 2 to 3 days ago.  Since then, she has had persistent burning, sharp, tingling pain in her right neck radiating to arms were her right arm.  She has developed a painful, erythematous rash along the right upper extremity, along with some vesicles.  She has had associated intermittent right-sided headache.  Denies any fevers.  No recent illness.  She does have a history of chickenpox in the past but no personal history of shingles.  No recent medication changes.  No recent immunosuppressant use.        Past Medical History:  Diagnosis Date  . Aortic atherosclerosis ([place])   . Atherosclerosis of right carotid artery   . Breast cancer ([place])    Breast left   . Collagen vascular disease ([place])   . Controlled type 2 diabetes mellitus with circulatory disorder, without long-term current use of insulin ([place])   . Coronary artery disease   . Diabetes mellitus without complication ([place])   . DNAR (do not attempt resuscitation) 09/14/2016   Discussed with patient, DNR confirmed September 14, 2016  . GI bleed not requiring more than 4 units of blood in 24 hours, ICU, or surgery Aug 15-17, 2015   2 units PRBCs  . Heart murmur   . Hypertension   . Hypertriglyceridemia   . Peripheral vascular disease ([place])   . Personal history of tobacco use, presenting hazards to health 06/18/2015  . Tobacco abuse   . Vertebral artery occlusion   . Vertigo     Patient Active Problem List   Diagnosis Date Noted  . DNAR (do  not attempt resuscitation) 09/14/2016  . Carotid stenosis 09/01/2016  . Hyperlipidemia with target LDL less than 70 08/07/2016  . Left carotid bruit 08/03/2016  . Cerebellar dysmetria 08/03/2016  . Preventative health care 08/09/2015  . Hx of breast cancer 08/03/2015  . Personal history of tobacco use, presenting hazards to health 06/18/2015  . B12 deficiency 05/20/2015  . Iliac artery aneurysm, right ([place]) 04/28/2015  . Esophageal thickening 04/28/2015  . Acquired atrophy of kidney 04/28/2015  . Microcytic hypochromic anemia 03/15/2015  . Chronic bilateral low back pain 03/12/2015  . Atherosclerotic peripheral vascular disease with ulceration ([place]) 03/12/2015  . Claudication ([place]) 03/12/2015  . Medication monitoring encounter 12/07/2014  . Essential hypertension, benign 05/23/2014  . Cardiac murmur 05/23/2014  . Peripheral vascular disease ([place]) 05/23/2014  . Proteinuria 05/23/2014  . Hypertriglyceridemia   . Atherosclerosis of right carotid artery   . Tobacco abuse   . Controlled type 2 diabetes mellitus with circulatory disorder, without long-term current use of insulin ([place])   . Aortic atherosclerosis ([place])   . Vertebral artery occlusion     Past Surgical History:  Procedure Laterality Date  . BREAST SURGERY Left 02/21/1988   Mastectomy  . CAROTID ENDARTERECTOMY Left July 2015  . CATARACT EXTRACTION W/PHACO Right 09/15/2019   Procedure: CATARACT EXTRACTION PHACO AND INTRAOCULAR LENS PLACEMENT (IOC) RIGHT DIABETIC 14.23 01:30.6;  Surgeon: Birder Robson, MD;  Location: [place];  Service: Ophthalmology;  Laterality: Right;  . CATARACT EXTRACTION W/PHACO Left 10/07/2019   Procedure: CATARACT EXTRACTION PHACO AND INTRAOCULAR LENS PLACEMENT (IOC) LEFT DIABETIC 4.71 00:32.6;  Surgeon: Birder Robson, MD;  Location: [place];  Service: Ophthalmology;  Laterality: Left;  . CHOLECYSTECTOMY  02/20/1978    Prior to Admission medications   Medication Sig Start  Date End Date Taking? Authorizing Provider  HYDROcodone-acetaminophen (NORCO/VICODIN) 5-325 MG tablet Take 1-2 tablets by mouth every 6 (six) hours as needed for moderate pain or severe pain (no more than 6 tabs per day). 05/26/20 05/26/21 Yes Duffy Bruce, MD  ondansetron (ZOFRAN ODT) 4 MG disintegrating tablet Take 1 tablet (4 mg total) by mouth every 8 (eight) hours as needed for nausea or vomiting. 05/26/20  Yes Duffy Bruce, MD  predniSONE (DELTASONE) 20 MG tablet Take 2 tablets (40 mg total) by mouth daily for 7 days. 05/26/20 06/02/20 Yes Duffy Bruce, MD  valACYclovir (VALTREX) 1000 MG tablet Take 1 tablet (1,000 mg total) by mouth 3 (three) times daily for 7 days. 05/26/20 06/02/20 Yes Duffy Bruce, MD  amLODipine (NORVASC) 5 MG tablet TAKE 1 TABLET  DAILY. FOR BLOOD PRESSURE 01/15/17   Arnetha Courser, MD  Ascorbic Acid (VITAMIN C) 100 MG tablet Take 282 mg by mouth daily.    [provider]  Cholecalciferol (VITAMIN D3) 10 MCG (400 UNIT) CAPS Take 50 mcg by mouth daily.    [provider]  clopidogrel (PLAVIX) 75 MG tablet TAKE 1 TABLET EVERY DAY 12/09/18   Schnier, Dolores Lory, MD  glimepiride (AMARYL) 4 MG tablet Take 1 tablet (4 mg total) by mouth daily before breakfast. Patient not taking: Reported on 09/04/2019 08/24/16   Arnetha Courser, MD  glipiZIDE (GLUCOTROL) 10 MG tablet Take 10 mg by mouth daily before breakfast.    [provider]  glucose blood (TRUETEST TEST) test strip Check fingerstick sugars once a day on average; Dx E11.9; LON 99 months 09/01/14   Arnetha Courser, MD  Krill Oil 350 MG CAPS Take 1,250 mg by mouth in the morning and at bedtime.     [provider]  Lancets 30G MISC Reli-On brand requested by patient; check fingerstick sugars once a day on average; Dx E11.9 09/01/14   Arnetha Courser, MD  lisinopril (PRINIVIL,ZESTRIL) 2.5 MG tablet Take 5 mg by mouth daily.     [provider]  meclizine (ANTIVERT) 25 MG tablet  08/09/16    [provider]  metFORMIN (GLUCOPHAGE) 500 MG tablet Take by mouth 2 (two) times daily with a meal. 1000 mg at breakfast and 500 mg at dinner    [provider]  metoprolol tartrate (LOPRESSOR) 50 MG tablet Take 0.5 tablets (25 mg total) by mouth 2 (two) times daily. 09/14/16   Arnetha Courser, MD  pantoprazole (PROTONIX) 20 MG tablet Take 20 mg by mouth daily.    [provider]  rosuvastatin (CRESTOR) 10 MG tablet Take 10 mg by mouth daily.    [provider]    Allergies Atorvastatin, Losartan, and Penicillins  Family History  Problem Relation Age of Onset  . Heart disease Mother        in her 72's  . Stroke Mother        63 yo  . Hypertension Mother   . Heart attack Mother   . Heart disease Father        30 yo  . Diabetes Father   . Asthma Father   .  Heart attack Father   . Hypertension Father   . Heart disease Brother   . Hypertension Daughter   . Anuerysm Maternal Grandmother   . Ulcers Maternal Grandfather   . Heart disease Paternal Grandmother   . Emphysema Paternal Grandfather   . Cancer Neg Hx   . COPD Neg Hx     Social History Social History   Tobacco Use  . Smoking status: Current Every Day Smoker    Packs/day: 0.50    Years: 50.00    Pack years: 25.00    Types: Cigarettes  . Smokeless tobacco: Never Used  Vaping Use  . Vaping Use: Former  Substance Use Topics  . Alcohol use: No    Alcohol/week: 0.0 standard drinks  . Drug use: No    Review of Systems  Review of Systems  Constitutional: Positive for fatigue. Negative for chills and fever.  HENT: Negative for sore throat.   Respiratory: Negative for shortness of breath.   Cardiovascular: Negative for chest pain.  Gastrointestinal: Negative for abdominal pain.  Genitourinary: Negative for flank pain.  Musculoskeletal: Positive for neck pain.  Skin: Positive for rash and wound.  Allergic/Immunologic: Negative for immunocompromised state.  Neurological:  Negative for weakness and numbness.  Hematological: Does not bruise/bleed easily.     ____________________________________________  PHYSICAL EXAM:      VITAL SIGNS: ED Triage Vitals  Enc Vitals Group     BP 05/26/20 0921 (!) 207/71     Pulse Rate 05/26/20 0921 62     Resp 05/26/20 0921 15     Temp 05/26/20 0921 97.8 F (36.6 C)     Temp Source 05/26/20 0921 Oral     SpO2 05/26/20 0921 97 %     Weight 05/26/20 0920 162 lb 0.6 oz (73.5 kg)     Height 05/26/20 0925 5\' 3"  (1.6 m)     Head Circumference --      Peak Flow --      Pain Score 05/26/20 0925 8     Pain Loc --      Pain Edu? --      Excl. in [place]? --      Physical Exam Vitals and nursing note reviewed.  Constitutional:      General: She is not in acute distress.    Appearance: She is well-developed.  HENT:     Head: Normocephalic and atraumatic.  Eyes:     Conjunctiva/sclera: Conjunctivae normal.  Neck:     Comments: Mild TTP over R paracervical musculature Cardiovascular:     Rate and Rhythm: Normal rate and regular rhythm.     Heart sounds: Normal heart sounds.  Pulmonary:     Effort: Pulmonary effort is normal. No respiratory distress.     Breath sounds: No wheezing.  Abdominal:     General: There is no distension.  Musculoskeletal:     Cervical back: Neck supple.  Skin:    General: Skin is warm.     Capillary Refill: Capillary refill takes less than 2 seconds.     Findings: No rash.     Comments: Erythematous vesicular rash with grouped vesicles and erythematous bases along the right shoulder extending to the distal hand along C7 distribution. No induration or fluctuance.  Neurological:     Mental Status: She is alert and oriented to person, place, and time.     Motor: No abnormal muscle tone.       ____________________________________________   LABS (all labs ordered are listed, but  only abnormal results are displayed)  Labs Reviewed  BASIC METABOLIC PANEL - Abnormal; Notable for the  following components:      Result Value   Sodium 134 (*)    Glucose, Bld 219 (*)    Creatinine, Ser 1.27 (*)    GFR, Estimated 45 (*)    All other components within normal limits  CBC - Abnormal; Notable for the following components:   WBC 11.4 (*)    All other components within normal limits  TROPONIN I (HIGH SENSITIVITY) - Abnormal; Notable for the following components:   Troponin I (High Sensitivity) 32 (*)    All other components within normal limits  TROPONIN I (HIGH SENSITIVITY) - Abnormal; Notable for the following components:   Troponin I (High Sensitivity) 28 (*)    All other components within normal limits  URINALYSIS, COMPLETE (UACMP) WITH MICROSCOPIC    ____________________________________________  EKG: Sinus bradycardia, ventricular rate 58.  PR 142, QRS 78, QTc 459.  Nonspecific T wave changes but no acute ST elevations or depressions. ________________________________________  RADIOLOGY All imaging, including plain films, CT scans, and ultrasounds, independently reviewed by me, and interpretations confirmed via formal radiology reads.  ED MD interpretation:   Chest x-ray: Clear  Official radiology report(s): No results found.  ____________________________________________  PROCEDURES   Procedure(s) performed (including Critical Care):  Procedures  ____________________________________________  INITIAL IMPRESSION / MDM / [place] / ED COURSE  As part of my medical decision making, I reviewed the following data within the [place] notes reviewed and incorporated, Old chart reviewed, Notes from prior ED visits, and [place] Controlled Substance Database       *Gerry Heaphy was evaluated in Emergency Department on 05/26/2020 for the symptoms described in the history of present illness. She was evaluated in the context of the global COVID-19 pandemic, which necessitated consideration that the patient might be at risk for  infection with the SARS-CoV-2 virus that causes COVID-19. Institutional protocols and algorithms that pertain to the evaluation of patients at risk for COVID-19 are in a state of rapid change based on information released by regulatory bodies including the CDC and federal and state organizations. These policies and algorithms were followed during the patient's care in the ED.  Some ED evaluations and interventions may be delayed as a result of limited staffing during the pandemic.*     Medical Decision Making: 75 year old pleasant female here with right-sided arm pain and rash.  Suspect herpes zoster based on distribution.  She has grouped vesicular rash along dermatomal distribution.  I suspect this is contributing to her neck pain.  Otherwise, no apparent emergent medical pathology.  There is no evidence of superimposed cellulitis.  No evidence of facial involvement.  Troponins are slightly elevated but I suspect this is baseline and she has no change in troponin, no chest pain, a nonischemic EKG, do not suspect referred cardiac symptoms.  Moreover, the rash is highly consistent with shingles.  Lab work otherwise reassuring.  Will start on prednisone, Valtrex, and give analgesia.  Return precautions and infection control precautions given.  ____________________________________________  FINAL CLINICAL IMPRESSION(S) / ED DIAGNOSES  Final diagnoses:  Herpes zoster without complication     MEDICATIONS GIVEN DURING THIS VISIT:  Medications  HYDROcodone-acetaminophen (NORCO/VICODIN) 5-325 MG per tablet 2 tablet (2 tablets Oral Given 05/26/20 1132)  ondansetron (ZOFRAN-ODT) disintegrating tablet 4 mg (4 mg Oral Given 05/26/20 1132)     ED Discharge Orders  Ordered    HYDROcodone-acetaminophen (NORCO/VICODIN) 5-325 MG tablet  Every 6 hours PRN        05/26/20 1248    ondansetron (ZOFRAN ODT) 4 MG disintegrating tablet  Every 8 hours PRN        05/26/20 1248    predniSONE (DELTASONE) 20 MG  tablet  Daily        05/26/20 1248    valACYclovir (VALTREX) 1000 MG tablet  3 times daily        05/26/20 1248           Note:  This document was prepared using Dragon voice recognition software and may include unintentional dictation errors.   Duffy Bruce, MD 05/26/20 1352

## 2020-07-01 DIAGNOSIS — E119 Type 2 diabetes mellitus without complications: Secondary | ICD-10-CM | POA: Diagnosis not present

## 2020-07-30 DIAGNOSIS — Z1329 Encounter for screening for other suspected endocrine disorder: Secondary | ICD-10-CM | POA: Diagnosis not present

## 2020-07-30 DIAGNOSIS — K760 Fatty (change of) liver, not elsewhere classified: Secondary | ICD-10-CM | POA: Diagnosis not present

## 2020-07-30 DIAGNOSIS — I6521 Occlusion and stenosis of right carotid artery: Secondary | ICD-10-CM | POA: Diagnosis not present

## 2020-07-30 DIAGNOSIS — I739 Peripheral vascular disease, unspecified: Secondary | ICD-10-CM | POA: Diagnosis not present

## 2020-07-30 DIAGNOSIS — E782 Mixed hyperlipidemia: Secondary | ICD-10-CM | POA: Diagnosis not present

## 2020-07-30 DIAGNOSIS — E1151 Type 2 diabetes mellitus with diabetic peripheral angiopathy without gangrene: Secondary | ICD-10-CM | POA: Diagnosis not present

## 2020-07-30 DIAGNOSIS — E538 Deficiency of other specified B group vitamins: Secondary | ICD-10-CM | POA: Diagnosis not present

## 2020-07-30 DIAGNOSIS — I25119 Atherosclerotic heart disease of native coronary artery with unspecified angina pectoris: Secondary | ICD-10-CM | POA: Diagnosis not present

## 2020-07-30 DIAGNOSIS — Z1321 Encounter for screening for nutritional disorder: Secondary | ICD-10-CM | POA: Diagnosis not present

## 2020-07-30 DIAGNOSIS — E559 Vitamin D deficiency, unspecified: Secondary | ICD-10-CM | POA: Diagnosis not present

## 2020-07-30 DIAGNOSIS — Z0000 Encounter for general adult medical examination without abnormal findings: Secondary | ICD-10-CM | POA: Diagnosis not present

## 2020-07-30 DIAGNOSIS — J431 Panlobular emphysema: Secondary | ICD-10-CM | POA: Diagnosis not present

## 2020-07-30 DIAGNOSIS — R918 Other nonspecific abnormal finding of lung field: Secondary | ICD-10-CM | POA: Diagnosis not present

## 2020-07-30 DIAGNOSIS — E119 Type 2 diabetes mellitus without complications: Secondary | ICD-10-CM | POA: Diagnosis not present

## 2020-07-30 DIAGNOSIS — I10 Essential (primary) hypertension: Secondary | ICD-10-CM | POA: Diagnosis not present

## 2020-07-30 DIAGNOSIS — I700 Atherosclerosis of aorta: Secondary | ICD-10-CM | POA: Diagnosis not present

## 2020-07-30 DIAGNOSIS — F321 Major depressive disorder, single episode, moderate: Secondary | ICD-10-CM | POA: Diagnosis not present

## 2020-08-11 DIAGNOSIS — Z961 Presence of intraocular lens: Secondary | ICD-10-CM | POA: Diagnosis not present

## 2020-10-26 DIAGNOSIS — E119 Type 2 diabetes mellitus without complications: Secondary | ICD-10-CM | POA: Diagnosis not present

## 2020-10-26 DIAGNOSIS — E782 Mixed hyperlipidemia: Secondary | ICD-10-CM | POA: Diagnosis not present

## 2020-10-26 DIAGNOSIS — R42 Dizziness and giddiness: Secondary | ICD-10-CM | POA: Diagnosis not present

## 2021-01-01 DIAGNOSIS — Z20822 Contact with and (suspected) exposure to covid-19: Secondary | ICD-10-CM | POA: Diagnosis not present

## 2021-01-01 DIAGNOSIS — U071 COVID-19: Secondary | ICD-10-CM | POA: Diagnosis not present

## 2021-02-02 DIAGNOSIS — E119 Type 2 diabetes mellitus without complications: Secondary | ICD-10-CM | POA: Diagnosis not present

## 2021-02-02 DIAGNOSIS — I10 Essential (primary) hypertension: Secondary | ICD-10-CM | POA: Diagnosis not present

## 2021-02-02 DIAGNOSIS — I25119 Atherosclerotic heart disease of native coronary artery with unspecified angina pectoris: Secondary | ICD-10-CM | POA: Diagnosis not present

## 2021-02-02 DIAGNOSIS — I700 Atherosclerosis of aorta: Secondary | ICD-10-CM | POA: Diagnosis not present

## 2021-02-02 DIAGNOSIS — R053 Chronic cough: Secondary | ICD-10-CM | POA: Diagnosis not present

## 2021-02-02 DIAGNOSIS — E781 Pure hyperglyceridemia: Secondary | ICD-10-CM | POA: Diagnosis not present

## 2021-02-02 DIAGNOSIS — F17210 Nicotine dependence, cigarettes, uncomplicated: Secondary | ICD-10-CM | POA: Diagnosis not present

## 2021-02-02 DIAGNOSIS — E782 Mixed hyperlipidemia: Secondary | ICD-10-CM | POA: Diagnosis not present

## 2021-02-02 DIAGNOSIS — H6123 Impacted cerumen, bilateral: Secondary | ICD-10-CM | POA: Diagnosis not present

## 2021-02-02 DIAGNOSIS — R918 Other nonspecific abnormal finding of lung field: Secondary | ICD-10-CM | POA: Diagnosis not present

## 2021-02-02 DIAGNOSIS — J431 Panlobular emphysema: Secondary | ICD-10-CM | POA: Diagnosis not present

## 2021-02-03 ENCOUNTER — Other Ambulatory Visit: Payer: Self-pay | Admitting: Gerontology

## 2021-02-03 DIAGNOSIS — F17210 Nicotine dependence, cigarettes, uncomplicated: Secondary | ICD-10-CM

## 2021-02-03 DIAGNOSIS — R053 Chronic cough: Secondary | ICD-10-CM

## 2021-03-01 ENCOUNTER — Other Ambulatory Visit: Payer: Self-pay

## 2021-03-01 ENCOUNTER — Ambulatory Visit
Admission: RE | Admit: 2021-03-01 | Discharge: 2021-03-01 | Disposition: A | Discharge status: Home / Self Care | Payer: No Typology Code available for payment source | Source: Ambulatory Visit | Attending: Gerontology | Admitting: Gerontology

## 2021-03-01 DIAGNOSIS — J439 Emphysema, unspecified: Secondary | ICD-10-CM | POA: Diagnosis not present

## 2021-03-01 DIAGNOSIS — I700 Atherosclerosis of aorta: Secondary | ICD-10-CM | POA: Diagnosis not present

## 2021-03-01 DIAGNOSIS — F17210 Nicotine dependence, cigarettes, uncomplicated: Secondary | ICD-10-CM | POA: Diagnosis not present

## 2021-03-01 DIAGNOSIS — R053 Chronic cough: Secondary | ICD-10-CM | POA: Insufficient documentation

## 2021-03-01 DIAGNOSIS — R911 Solitary pulmonary nodule: Secondary | ICD-10-CM | POA: Diagnosis not present

## 2023-05-22 DEATH — deceased
